# Patient Record
Sex: Male | Born: 1955 | Race: White | Hispanic: No | Marital: Married | Smoking: Never smoker
Health system: Southern US, Community
[De-identification: ages and names within clinical notes are randomized; demographics above are authoritative.]

## PROBLEM LIST (undated history)

## (undated) DIAGNOSIS — E611 Iron deficiency: Secondary | ICD-10-CM

## (undated) DIAGNOSIS — C61 Malignant neoplasm of prostate: Secondary | ICD-10-CM

## (undated) DIAGNOSIS — J40 Bronchitis, not specified as acute or chronic: Secondary | ICD-10-CM

## (undated) DIAGNOSIS — K219 Gastro-esophageal reflux disease without esophagitis: Secondary | ICD-10-CM

## (undated) HISTORY — PX: BACK SURGERY: SHX140

## (undated) HISTORY — PX: HERNIA REPAIR: SHX51

---

## 1998-08-31 ENCOUNTER — Inpatient Hospital Stay (HOSPITAL_COMMUNITY): Admission: RE | Admit: 1998-08-31 | Discharge: 1998-09-01 | Payer: Self-pay | Admitting: Neurosurgery

## 1998-08-31 ENCOUNTER — Encounter: Payer: Self-pay | Admitting: Neurosurgery

## 1998-09-07 ENCOUNTER — Encounter: Payer: Self-pay | Admitting: Internal Medicine

## 1998-09-08 ENCOUNTER — Inpatient Hospital Stay (HOSPITAL_COMMUNITY): Admission: EM | Admit: 1998-09-08 | Discharge: 1998-09-09 | Payer: Self-pay | Admitting: Emergency Medicine

## 1999-12-26 ENCOUNTER — Encounter: Admission: RE | Admit: 1999-12-26 | Discharge: 1999-12-26 | Payer: Self-pay | Admitting: Neurosurgery

## 1999-12-26 ENCOUNTER — Encounter: Payer: Self-pay | Admitting: Neurosurgery

## 2017-08-14 DIAGNOSIS — E785 Hyperlipidemia, unspecified: Secondary | ICD-10-CM | POA: Insufficient documentation

## 2017-08-14 DIAGNOSIS — M545 Low back pain, unspecified: Secondary | ICD-10-CM | POA: Insufficient documentation

## 2017-12-02 DIAGNOSIS — C61 Malignant neoplasm of prostate: Secondary | ICD-10-CM

## 2017-12-02 HISTORY — DX: Malignant neoplasm of prostate: C61

## 2017-12-16 ENCOUNTER — Telehealth: Payer: Self-pay | Admitting: Radiation Oncology

## 2017-12-16 NOTE — Telephone Encounter (Signed)
Patient left message on nurse Sam P voice mail on 12/15/2017 that he wanted to reschedule 12/17/2017 appointment with Dr. Tammi Klippel. I left patient a message today to get this appointment rescheduled.

## 2017-12-17 ENCOUNTER — Telehealth: Payer: Self-pay | Admitting: Radiation Oncology

## 2017-12-17 ENCOUNTER — Ambulatory Visit: Payer: Self-pay | Admitting: Radiation Oncology

## 2017-12-17 ENCOUNTER — Encounter: Payer: Self-pay | Admitting: Radiation Oncology

## 2017-12-17 NOTE — Telephone Encounter (Signed)
I received an in-basket from nurse Sam that this patient left a message on 12/15/17 that he will not be able to come to his appointment on 12/17/2017 with Dr. Tammi Klippel. I have left two messages on 12/16/17 & 12/17/17 to reschedule this patient appointment. The appt has been rs to 10/31/2019at 3:30 pm a letter has also been mailed to the patient.

## 2017-12-29 ENCOUNTER — Telehealth: Payer: Self-pay | Admitting: Radiation Oncology

## 2017-12-29 NOTE — Telephone Encounter (Signed)
Received voicemail from patient wishing to confirm appointment on 01/01/18. Phoned patient back at number provided. No answer. Left message confirming this RN received his voicemail confirmation.

## 2017-12-30 NOTE — Progress Notes (Signed)
GU Location of Tumor / Histology: prostatic adenocarcinoma  If Prostate Cancer, Gleason Score is (4 + 3) and PSA is (8.26). Prostate volume: 14.83 grams.  Stephen Cordova was referred by Dr. Willey Blade to Dr Gloriann Loan after a work screening revealed an elevated PSA for further evaluation of an elevated PSA. Patient reports that before March 2019 he had never been told his PSA was elevated before.    Biopsies of prostate (if applicable) revealed:    Past/Anticipated interventions by urology, if any: prostate biopsy, referral to radiation oncology  Past/Anticipated interventions by medical oncology, if any: no  Weight changes, if any: no  Bowel/Bladder complaints, if any: IPSS 5. SHIM 17.Denies urinary incontinence or leakage. Reports ED.  Denies dysuria, hematuria, urinary leakage or incontinence.  Nausea/Vomiting, if any: no  Pain issues, if any:  Chronic back pain s/p multiple surgeries.  SAFETY ISSUES:  Prior radiation? no  Pacemaker/ICD? no  Possible current pregnancy? no  Is the patient on methotrexate? no  Current Complaints / other details:  62 year old. Married. 2 daughters one deceased.   Family hx:  Brain ca -  Daughter   Liver ca -  Father   Lung ca -  Father   Breast - Mother   Paternal Uncle - cancer ????

## 2018-01-01 ENCOUNTER — Encounter: Payer: Self-pay | Admitting: Radiation Oncology

## 2018-01-01 ENCOUNTER — Other Ambulatory Visit: Payer: Self-pay

## 2018-01-01 ENCOUNTER — Ambulatory Visit
Admission: RE | Admit: 2018-01-01 | Discharge: 2018-01-01 | Disposition: A | Discharge status: Home / Self Care | Payer: BLUE CROSS/BLUE SHIELD | Source: Ambulatory Visit | Attending: Radiation Oncology | Admitting: Radiation Oncology

## 2018-01-01 VITALS — BP 134/80 | HR 80 | Temp 97.7°F | Resp 18 | Ht 71.0 in | Wt 220.2 lb

## 2018-01-01 DIAGNOSIS — Z79899 Other long term (current) drug therapy: Secondary | ICD-10-CM | POA: Diagnosis not present

## 2018-01-01 DIAGNOSIS — C61 Malignant neoplasm of prostate: Secondary | ICD-10-CM

## 2018-01-01 DIAGNOSIS — E669 Obesity, unspecified: Secondary | ICD-10-CM | POA: Insufficient documentation

## 2018-01-01 DIAGNOSIS — G4700 Insomnia, unspecified: Secondary | ICD-10-CM | POA: Insufficient documentation

## 2018-01-01 HISTORY — DX: Malignant neoplasm of prostate: C61

## 2018-01-01 NOTE — Progress Notes (Signed)
Radiation Oncology         [phone] ________________________________  Initial outpatient Consultation  Name: Stephen Cordova MRN: [id number]  Date: 01/01/2018  DOB: April 28, 1955  [id number], Stephen Millin, MD  Stephen Mallow, MD   REFERRING PHYSICIAN: Lucas Mallow, MD  DIAGNOSIS: 62 y.o. gentleman with Stage T1c adenocarcinoma of the prostate with Gleason score of 4+3, and PSA of 8.26.    ICD-10-CM   1. Malignant neoplasm of prostate ([place]) C61     HISTORY OF PRESENT ILLNESS: Stephen Cordova is a 62 y.o. male with a diagnosis of prostate cancer. He was noted to have an elevated PSA of 8.26 by his primary care physician, Dr. Karlton Cordova.  Accordingly, he was referred for evaluation in urology by Dr. Gloriann Cordova on 10/10/2017,  digital rectal examination was performed at that time revealing no nodules, though he notes difficulty palpating secondary to anatomy.  The patient proceeded to transrectal ultrasound with 12 biopsies of the prostate on 12/02/2017.  The prostate volume measured 14.83 cc.  Out of 12 core biopsies, 6 were positive.  The maximum Gleason score was 4+3, and this was seen in all 6 cores on the left with evidence of perineural invasion.  The patient reviewed the biopsy results with his urologist and he has kindly been referred today for discussion of potential radiation treatment options.   PREVIOUS RADIATION THERAPY: No  PAST MEDICAL HISTORY:  Past Medical History:  Diagnosis Date  . Prostate cancer ([place]) 12/02/2017      PAST SURGICAL HISTORY:History reviewed. No pertinent surgical history.  FAMILY HISTORY:  Family History  Problem Relation Age of Onset  . Lung cancer Father   . Liver cancer Father   . Cancer Father   . Brain cancer Daughter   . Breast cancer Mother     SOCIAL HISTORY:  Social History   Socioeconomic History  . Marital status: Married    Spouse name: Not on file  . Number of children: Not on file  . Years of education: Not on file  .  Highest education level: Not on file  Occupational History  . Not on file  Social Needs  . Financial resource strain: Not on file  . Food insecurity:    Worry: Not on file    Inability: Not on file  . Transportation needs:    Medical: Not on file    Non-medical: Not on file  Tobacco Use  . Smoking status: Not on file  Substance and Sexual Activity  . Alcohol use: Not on file  . Drug use: Not on file  . Sexual activity: Not on file  Lifestyle  . Physical activity:    Days per week: Not on file    Minutes per session: Not on file  . Stress: Not on file  Relationships  . Social connections:    Talks on phone: Not on file    Gets together: Not on file    Attends religious service: Not on file    Active member of club or organization: Not on file    Attends meetings of clubs or organizations: Not on file    Relationship status: Not on file  . Intimate partner violence:    Fear of current or ex partner: Not on file    Emotionally abused: Not on file    Physically abused: Not on file    Forced sexual activity: Not on file  Other Topics Concern  . Not on file  Social History  Narrative  . Not on file    ALLERGIES: Patient has no known allergies.  MEDICATIONS:  Current Outpatient Medications  Medication Sig Dispense Refill  . buPROPion (WELLBUTRIN SR) 150 MG 12 hr tablet bupropion HCl SR 150 mg tablet,12 hr sustained-release    . diphenhydrAMINE (SOMINEX) 25 MG tablet Sleep Easy  Take 1 or 2 capsules at bedtime    . fluorouracil (EFUDEX) 5 % cream fluorouracil 5 % topical cream    . fluorouracil (EFUDEX) 5 % cream   0  . levofloxacin (LEVAQUIN) 750 MG tablet TK 1 T PO THE MORNING OF THE PROCEDURE  0  . NON FORMULARY CBD oil 500 mg  Take 1 dropperful by mouth at bedtime    . sildenafil (VIAGRA) 100 MG tablet sildenafil 100 mg tablet  Take 1 tablet every day by oral route as directed.     No current facility-administered medications for this encounter.     REVIEW OF  SYSTEMS:  On review of systems, the patient reports that he is doing well overall. He denies any chest pain, shortness of breath, cough, fevers, chills, night sweats, unintended weight changes. He denies any bowel disturbances, and denies abdominal pain, nausea or vomiting. He denies any new musculoskeletal or joint aches or pains. His IPSS was 5, indicating mild urinary symptoms. His SHIM was 16, indicating he does have mild erectile dysfunction. A complete review of systems is obtained and is otherwise negative.    PHYSICAL EXAM:  Wt Readings from Last 3 Encounters:  01/01/18 220 lb 4 oz (99.9 kg)   Temp Readings from Last 3 Encounters:  01/01/18 97.7 F (36.5 C) (Oral)   BP Readings from Last 3 Encounters:  01/01/18 134/80   Pulse Readings from Last 3 Encounters:  01/01/18 80   Pain Assessment Pain Score: 0-No pain/10  In general this is a well appearing Caucasian gentleman in no acute distress. He is alert and oriented x4 and appropriate throughout the examination. HEENT reveals that the patient is normocephalic, atraumatic. EOMs are intact. PERRLA. Skin is intact without any evidence of gross lesions. Cardiovascular exam reveals a regular rate and rhythm, no clicks rubs or murmurs are auscultated. Chest is clear to auscultation bilaterally. Lymphatic assessment is performed and does not reveal any adenopathy in the cervical, supraclavicular, axillary, or inguinal chains. Abdomen has active bowel sounds in all quadrants and is intact. The abdomen is soft, non tender, non distended. Lower extremities are negative for pretibial pitting edema, deep calf tenderness, cyanosis or clubbing.   KPS = 100  100 - Normal; no complaints; no evidence of disease. 90   - Able to carry on normal activity; minor signs or symptoms of disease. 80   - Normal activity with effort; some signs or symptoms of disease. 101   - Cares for self; unable to carry on normal activity or to do active work. 60   -  Requires occasional assistance, but is able to care for most of his personal needs. 50   - Requires considerable assistance and frequent medical care. 49   - Disabled; requires special care and assistance. 73   - Severely disabled; hospital admission is indicated although death not imminent. 83   - Very sick; hospital admission necessary; active supportive treatment necessary. 10   - Moribund; fatal processes progressing rapidly. 0     - Dead  Karnofsky DA, Abelmann WH, Craver LS and Burchenal [facility] [phone]) The use of the nitrogen mustards in the palliative treatment of carcinoma:  with particular reference to bronchogenic carcinoma Cancer 1 634-56  LABORATORY DATA:  No results found for: WBC, HGB, HCT, MCV, PLT No results found for: NA, K, CL, CO2 No results found for: ALT, AST, GGT, ALKPHOS, BILITOT   RADIOGRAPHY: No results found.    IMPRESSION/PLAN: 1. 62 y.o. gentleman with Stage T1c adenocarcinoma of the prostate with Gleason Score of 4+3, and PSA of 8.26. We discussed the patient's workup and outlined the nature of prostate cancer in this setting. The patient's T stage, Gleason's score, and PSA put him into the unfavorable intermediate risk group. Accordingly, he is eligible for a variety of potential treatment options including brachytherapy, 5.5-8 weeks of external radiation or 5 weeks of external radiation followed by a brachytherapy boost. We discussed the available radiation techniques, and focused on the details and logistics and delivery.  He is not felt to be an ideal candidate for brachytherapy given his very small prostate volume.  We discussed and outlined the risks, benefits, short and long-term effects associated with radiotherapy and compared and contrasted these with prostatectomy. Should he choose radiotherapy for treatment of his disease, our recommendation would be to proceed with a 5-1/2-week course of daily radiotherapy.  We discussed the role of SpaceOAR in reducing the  rectal toxicity associated with radiotherapy.  The patient focused most of his questions and interest in robotic-assisted laparoscopic radical prostatectomy.  We discussed some of the potential advantages of surgery including surgical staging, the availability of salvage radiotherapy to the prostatic fossa, and the confidence associated with immediate biochemical response.  We discussed some of the potential proven indications for postoperative radiotherapy including positive margins, extracapsular extension, and seminal vesicle involvement. We also talked about some of the other potential findings leading to a recommendation for radiotherapy including a non-zero postoperative PSA and positive lymph nodes.  At the end of the conversation, the patient appears to be leaning towards prostatectomy but remains undecided.  We will plan to follow up by phone in the next several weeks to answer any additional questions and ascertain his treatment preference.  Once he has reached his final decision, we will share this information with Dr. Gloriann Cordova and move forward with treatment planning accordingly.  We spent 60 minutes face to face with the patient and more than 50% of that time was spent in counseling and/or coordination of care.    Nicholos Johns, PA-C    Tyler Pita, MD  [facility] Direct Dial: [phone]  Fax: [phone] [email or web address]  Skype  LinkedIn   This document serves as a record of services personally performed by Tyler Pita, MD and Freeman Caldron, PA-C. It was created on their behalf by Wilburn Mylar, a trained medical scribe. The creation of this record is based on the scribe's personal observations and the provider's statements to them. This document has been checked and approved by the attending provider.

## 2018-01-01 NOTE — Progress Notes (Signed)
See progress note under physician encounter. 

## 2018-01-05 ENCOUNTER — Encounter: Payer: Self-pay | Admitting: Radiation Oncology

## 2018-01-06 DIAGNOSIS — C61 Malignant neoplasm of prostate: Secondary | ICD-10-CM | POA: Insufficient documentation

## 2018-01-16 ENCOUNTER — Other Ambulatory Visit: Payer: Self-pay | Admitting: Urology

## 2018-01-19 ENCOUNTER — Encounter: Payer: Self-pay | Admitting: Medical Oncology

## 2018-01-19 NOTE — Progress Notes (Signed)
Mr. Mcgillis consulted with Dr. Tammi Klippel 01/01/18. He was undecided about treatment but was leaning towards prostatectomy. He met with Dr. Gloriann Loan 12/15/17 and had decided to proceed with surgery. I wished him well and asked him to call if I can assist him in any way in the future. Dr. Forrest Moron notified of decision.

## 2018-02-12 NOTE — Patient Instructions (Addendum)
Stephen Cordova  02/12/2018   Your procedure is scheduled on: 02-23-18  Report to [facility] Main  Entrance  Report to admitting at     [place] AM    Call this number if you have problems the morning of surgery [phone]    Remember: Do not eat food or drink liquids :After Midnight.   BRUSH YOUR TEETH MORNING OF SURGERY AND RINSE YOUR MOUTH OUT, NO CHEWING GUM CANDY OR MINTS.     Take these medicines the morning of surgery with A SIP OF WATER :none                                 You may not have any metal on your body including hair pins and              piercings  Do not wear jewelry,  lotions, powders or perfumes, deodorant                        Men may shave face and neck.   Do not bring valuables to the hospital. [place].  Contacts, dentures or bridgework may not be worn into surgery.  Leave suitcase in the car. After surgery it may be brought to your room.               Please read over the following fact sheets you were given: _____________________________________________________________________           [facility] - Preparing for Surgery Before surgery, you can play an important role.  Because skin is not sterile, your skin needs to be as free of germs as possible.  You can reduce the number of germs on your skin by washing with CHG (chlorahexidine gluconate) soap before surgery.  CHG is an antiseptic cleaner which kills germs and bonds with the skin to continue killing germs even after washing. Please DO NOT use if you have an allergy to CHG or antibacterial soaps.  If your skin becomes reddened/irritated stop using the CHG and inform your nurse when you arrive at Short Stay. Do not shave (including legs and underarms) for at least 48 hours prior to the first CHG shower.  You may shave your face/neck. Please follow these instructions carefully:  1.  Shower with CHG Soap the night before  surgery and the  morning of Surgery.  2.  If you choose to wash your hair, wash your hair first as usual with your  normal  shampoo.  3.  After you shampoo, rinse your hair and body thoroughly to remove the  shampoo.                           4.  Use CHG as you would any other liquid soap.  You can apply chg directly  to the skin and wash                       Gently with a scrungie or clean washcloth.  5.  Apply the CHG Soap to your body ONLY FROM THE NECK DOWN.   Do not use on face/ open  Wound or open sores. Avoid contact with eyes, ears mouth and genitals (private parts).                       Wash face,  Genitals (private parts) with your normal soap.             6.  Wash thoroughly, paying special attention to the area where your surgery  will be performed.  7.  Thoroughly rinse your body with warm water from the neck down.  8.  DO NOT shower/wash with your normal soap after using and rinsing off  the CHG Soap.                9.  Pat yourself dry with a clean towel.            10.  Wear clean pajamas.            11.  Place clean sheets on your bed the night of your first shower and do not  sleep with pets. Day of Surgery : Do not apply any lotions/deodorants the morning of surgery.  Please wear clean clothes to the hospital/surgery center.  FAILURE TO FOLLOW THESE INSTRUCTIONS MAY RESULT IN THE CANCELLATION OF YOUR SURGERY PATIENT SIGNATURE_________________________________  NURSE SIGNATURE__________________________________  ________________________________________________________________________  WHAT IS A BLOOD TRANSFUSION? Blood Transfusion Information  A transfusion is the replacement of blood or some of its parts. Blood is made up of multiple cells which provide different functions.  Red blood cells carry oxygen and are used for blood loss replacement.  White blood cells fight against infection.  Platelets control bleeding.  Plasma helps clot  blood.  Other blood products are available for specialized needs, such as hemophilia or other clotting disorders. BEFORE THE TRANSFUSION  Who gives blood for transfusions?   Healthy volunteers who are fully evaluated to make sure their blood is safe. This is blood bank blood. Transfusion therapy is the safest it has ever been in the practice of medicine. Before blood is taken from a donor, a complete history is taken to make sure that person has no history of diseases nor engages in risky social behavior (examples are intravenous drug use or sexual activity with multiple partners). The donor's travel history is screened to minimize risk of transmitting infections, such as malaria. The donated blood is tested for signs of infectious diseases, such as HIV and hepatitis. The blood is then tested to be sure it is compatible with you in order to minimize the chance of a transfusion reaction. If you or a relative donates blood, this is often done in anticipation of surgery and is not appropriate for emergency situations. It takes many days to process the donated blood. RISKS AND COMPLICATIONS Although transfusion therapy is very safe and saves many lives, the main dangers of transfusion include:   Getting an infectious disease.  Developing a transfusion reaction. This is an allergic reaction to something in the blood you were given. Every precaution is taken to prevent this. The decision to have a blood transfusion has been considered carefully by your caregiver before blood is given. Blood is not given unless the benefits outweigh the risks. AFTER THE TRANSFUSION  Right after receiving a blood transfusion, you will usually feel much better and more energetic. This is especially true if your red blood cells have gotten low (anemic). The transfusion raises the level of the red blood cells which carry oxygen, and this usually causes an energy increase.  The  nurse administering the transfusion will monitor  you carefully for complications. HOME CARE INSTRUCTIONS  No special instructions are needed after a transfusion. You may find your energy is better. Speak with your caregiver about any limitations on activity for underlying diseases you may have. SEEK MEDICAL CARE IF:   Your condition is not improving after your transfusion.  You develop redness or irritation at the intravenous (IV) site. SEEK IMMEDIATE MEDICAL CARE IF:  Any of the following symptoms occur over the next 12 hours:  Shaking chills.  You have a temperature by mouth above 102 F (38.9 C), not controlled by medicine.  Chest, back, or muscle pain.  People around you feel you are not acting correctly or are confused.  Shortness of breath or difficulty breathing.  Dizziness and fainting.  You get a rash or develop hives.  You have a decrease in urine output.  Your urine turns a dark color or changes to pink, red, or brown. Any of the following symptoms occur over the next 10 days:  You have a temperature by mouth above 102 F (38.9 C), not controlled by medicine.  Shortness of breath.  Weakness after normal activity.  The white part of the eye turns yellow (jaundice).  You have a decrease in the amount of urine or are urinating less often.  Your urine turns a dark color or changes to pink, red, or brown. Document Released: 02/16/2000 Document Revised: 05/13/2011 Document Reviewed: 10/05/2007 [facility] Patient Information 2014 [place], [place].  _______________________________________________________________________

## 2018-02-17 ENCOUNTER — Encounter (HOSPITAL_COMMUNITY): Payer: Self-pay

## 2018-02-17 ENCOUNTER — Other Ambulatory Visit: Payer: Self-pay

## 2018-02-17 ENCOUNTER — Encounter (HOSPITAL_COMMUNITY)
Admission: RE | Admit: 2018-02-17 | Discharge: 2018-02-17 | Disposition: A | Discharge status: Home / Self Care | Payer: BLUE CROSS/BLUE SHIELD | Source: Ambulatory Visit | Attending: Urology | Admitting: Urology

## 2018-02-17 DIAGNOSIS — Z01812 Encounter for preprocedural laboratory examination: Secondary | ICD-10-CM | POA: Insufficient documentation

## 2018-02-17 DIAGNOSIS — C61 Malignant neoplasm of prostate: Secondary | ICD-10-CM | POA: Diagnosis not present

## 2018-02-17 HISTORY — DX: Iron deficiency: E61.1

## 2018-02-17 HISTORY — DX: Gastro-esophageal reflux disease without esophagitis: K21.9

## 2018-02-17 HISTORY — DX: Bronchitis, not specified as acute or chronic: J40

## 2018-02-17 LAB — CBC
HCT: 43 % (ref 39.0–52.0)
Hemoglobin: 14.2 g/dL (ref 13.0–17.0)
MCH: 29.1 pg (ref 26.0–34.0)
MCHC: 33 g/dL (ref 30.0–36.0)
MCV: 88.1 fL (ref 80.0–100.0)
NRBC: 0 % (ref 0.0–0.2)
PLATELETS: 240 10*3/uL (ref 150–400)
RBC: 4.88 MIL/uL (ref 4.22–5.81)
RDW: 13 % (ref 11.5–15.5)
WBC: 7.7 10*3/uL (ref 4.0–10.5)

## 2018-02-17 LAB — BASIC METABOLIC PANEL
ANION GAP: 7 (ref 5–15)
BUN: 14 mg/dL (ref 8–23)
CALCIUM: 8.9 mg/dL (ref 8.9–10.3)
CHLORIDE: 106 mmol/L (ref 98–111)
CO2: 26 mmol/L (ref 22–32)
Creatinine, Ser: 0.89 mg/dL (ref 0.61–1.24)
GFR calc Af Amer: >60 mL/min (ref >60)
GFR calc non Af Amer: >60 mL/min (ref >60)
Glucose, Bld: 111 mg/dL — ABNORMAL HIGH (ref 70–99)
Potassium: 3.9 mmol/L (ref 3.5–5.1)
Sodium: 139 mmol/L (ref 135–145)

## 2018-02-17 LAB — PROTIME-INR
INR: 0.95
PROTHROMBIN TIME: 12.6 s (ref 11.4–15.2)

## 2018-02-17 LAB — ABO/RH: ABO/RH(D): O POS

## 2018-02-23 ENCOUNTER — Other Ambulatory Visit: Payer: Self-pay

## 2018-02-23 ENCOUNTER — Ambulatory Visit (HOSPITAL_COMMUNITY): Payer: BLUE CROSS/BLUE SHIELD | Admitting: Certified Registered Nurse Anesthetist

## 2018-02-23 ENCOUNTER — Ambulatory Visit (HOSPITAL_COMMUNITY)
Admission: RE | Admit: 2018-02-23 | Discharge: 2018-02-24 | Disposition: A | Discharge status: Home / Self Care | Payer: BLUE CROSS/BLUE SHIELD | Attending: Urology | Admitting: Urology

## 2018-02-23 ENCOUNTER — Encounter (HOSPITAL_COMMUNITY): Payer: Self-pay | Admitting: General Practice

## 2018-02-23 ENCOUNTER — Encounter (HOSPITAL_COMMUNITY)
Admission: RE | Disposition: A | Discharge status: Home / Self Care | Payer: Self-pay | Source: Home / Self Care | Attending: Urology

## 2018-02-23 DIAGNOSIS — C61 Malignant neoplasm of prostate: Secondary | ICD-10-CM | POA: Insufficient documentation

## 2018-02-23 DIAGNOSIS — K219 Gastro-esophageal reflux disease without esophagitis: Secondary | ICD-10-CM | POA: Diagnosis not present

## 2018-02-23 DIAGNOSIS — Z79899 Other long term (current) drug therapy: Secondary | ICD-10-CM | POA: Diagnosis not present

## 2018-02-23 HISTORY — PX: PELVIC LYMPH NODE DISSECTION: SHX6543

## 2018-02-23 HISTORY — PX: ROBOT ASSISTED LAPAROSCOPIC RADICAL PROSTATECTOMY: SHX5141

## 2018-02-23 LAB — BASIC METABOLIC PANEL
ANION GAP: 9 (ref 5–15)
BUN: 15 mg/dL (ref 8–23)
CO2: 25 mmol/L (ref 22–32)
Calcium: 8 mg/dL — ABNORMAL LOW (ref 8.9–10.3)
Chloride: 105 mmol/L (ref 98–111)
Creatinine, Ser: 1.14 mg/dL (ref 0.61–1.24)
GFR calc Af Amer: >60 mL/min (ref >60)
Glucose, Bld: 134 mg/dL — ABNORMAL HIGH (ref 70–99)
Potassium: 3.8 mmol/L (ref 3.5–5.1)
Sodium: 139 mmol/L (ref 135–145)

## 2018-02-23 LAB — HEMOGLOBIN AND HEMATOCRIT, BLOOD
HCT: 40 % (ref 39.0–52.0)
Hemoglobin: 13 g/dL (ref 13.0–17.0)

## 2018-02-23 LAB — TYPE AND SCREEN
ABO/RH(D): O POS
ANTIBODY SCREEN: NEGATIVE

## 2018-02-23 SURGERY — PROSTATECTOMY, RADICAL, ROBOT-ASSISTED, LAPAROSCOPIC
Anesthesia: General

## 2018-02-23 MED ORDER — FENTANYL CITRATE (PF) 100 MCG/2ML IJ SOLN
INTRAMUSCULAR | Status: AC
  Filled 2018-02-23: qty 2

## 2018-02-23 MED ORDER — LACTATED RINGERS IV SOLN
INTRAVENOUS | Status: DC
  Administered 2018-02-23: 10:00:00 via INTRAVENOUS

## 2018-02-23 MED ORDER — DEXTROSE-NACL 5-0.45 % IV SOLN
INTRAVENOUS | Status: AC
  Administered 2018-02-23 – 2018-02-24 (×2): via INTRAVENOUS

## 2018-02-23 MED ORDER — PROPOFOL 10 MG/ML IV BOLUS
INTRAVENOUS | Status: DC | PRN
  Administered 2018-02-23: 170 mg via INTRAVENOUS

## 2018-02-23 MED ORDER — ROCURONIUM BROMIDE 10 MG/ML (PF) SYRINGE
PREFILLED_SYRINGE | INTRAVENOUS | Status: AC
  Filled 2018-02-23: qty 10

## 2018-02-23 MED ORDER — FENTANYL CITRATE (PF) 100 MCG/2ML IJ SOLN
INTRAMUSCULAR | Status: DC | PRN
  Administered 2018-02-23 (×5): 50 ug via INTRAVENOUS
  Administered 2018-02-23: 100 ug via INTRAVENOUS

## 2018-02-23 MED ORDER — ONDANSETRON HCL 4 MG/2ML IJ SOLN: 4.0000 mg | Freq: Once | INTRAMUSCULAR | Status: DC | PRN

## 2018-02-23 MED ORDER — EPHEDRINE SULFATE-NACL 50-0.9 MG/10ML-% IV SOSY
PREFILLED_SYRINGE | INTRAVENOUS | Status: DC | PRN
  Administered 2018-02-23 (×2): 10 mg via INTRAVENOUS

## 2018-02-23 MED ORDER — ONDANSETRON HCL 4 MG/2ML IJ SOLN
INTRAMUSCULAR | Status: AC
  Filled 2018-02-23: qty 2

## 2018-02-23 MED ORDER — STERILE WATER FOR IRRIGATION IR SOLN
Status: DC | PRN
  Administered 2018-02-23: 1000 mL

## 2018-02-23 MED ORDER — FENTANYL CITRATE (PF) 250 MCG/5ML IJ SOLN
INTRAMUSCULAR | Status: AC
  Filled 2018-02-23: qty 5

## 2018-02-23 MED ORDER — LIDOCAINE 2% (20 MG/ML) 5 ML SYRINGE
INTRAMUSCULAR | Status: DC | PRN
  Administered 2018-02-23: 100 mg via INTRAVENOUS

## 2018-02-23 MED ORDER — OXYCODONE HCL 5 MG/5ML PO SOLN: 5.0000 mg | Freq: Once | ORAL | Status: DC | PRN

## 2018-02-23 MED ORDER — SODIUM CHLORIDE 0.9 % IV BOLUS
1000.0000 mL | Freq: Once | INTRAVENOUS | Status: AC
  Administered 2018-02-23: 1000 mL via INTRAVENOUS

## 2018-02-23 MED ORDER — BACITRACIN-NEOMYCIN-POLYMYXIN 400-5-5000 EX OINT: 1.0000 "application " | TOPICAL_OINTMENT | Freq: Three times a day (TID) | CUTANEOUS | Status: DC | PRN

## 2018-02-23 MED ORDER — LACTATED RINGERS IR SOLN
Status: DC | PRN
  Administered 2018-02-23: 1000 mL

## 2018-02-23 MED ORDER — OXYCODONE HCL 5 MG PO TABS: 5.0000 mg | ORAL_TABLET | Freq: Once | ORAL | Status: DC | PRN

## 2018-02-23 MED ORDER — DOCUSATE SODIUM 100 MG PO CAPS
100.0000 mg | ORAL_CAPSULE | Freq: Two times a day (BID) | ORAL | Status: DC
  Administered 2018-02-24: 100 mg via ORAL
  Filled 2018-02-23: qty 1

## 2018-02-23 MED ORDER — SULFAMETHOXAZOLE-TRIMETHOPRIM 800-160 MG PO TABS: 1.0000 | ORAL_TABLET | Freq: Two times a day (BID) | ORAL | 0 refills | Status: AC

## 2018-02-23 MED ORDER — LACTATED RINGERS IV SOLN
INTRAVENOUS | Status: DC | PRN
  Administered 2018-02-23 (×3): via INTRAVENOUS

## 2018-02-23 MED ORDER — ACETAMINOPHEN 325 MG PO TABS: 325.0000 mg | ORAL_TABLET | ORAL | Status: DC | PRN

## 2018-02-23 MED ORDER — OXYCODONE HCL 5 MG PO TABS: 5.0000 mg | ORAL_TABLET | ORAL | Status: DC | PRN

## 2018-02-23 MED ORDER — FENTANYL CITRATE (PF) 100 MCG/2ML IJ SOLN
25.0000 ug | INTRAMUSCULAR | Status: DC | PRN
  Administered 2018-02-23 (×2): 50 ug via INTRAVENOUS

## 2018-02-23 MED ORDER — ONDANSETRON HCL 4 MG/2ML IJ SOLN
INTRAMUSCULAR | Status: DC | PRN
  Administered 2018-02-23: 4 mg via INTRAVENOUS

## 2018-02-23 MED ORDER — MEPERIDINE HCL 50 MG/ML IJ SOLN: 6.2500 mg | INTRAMUSCULAR | Status: DC | PRN

## 2018-02-23 MED ORDER — SODIUM CHLORIDE (PF) 0.9 % IJ SOLN
INTRAMUSCULAR | Status: AC
  Filled 2018-02-23: qty 20

## 2018-02-23 MED ORDER — CEFAZOLIN SODIUM-DEXTROSE 2-4 GM/100ML-% IV SOLN
2.0000 g | INTRAVENOUS | Status: AC
  Administered 2018-02-23: 2 g via INTRAVENOUS
  Filled 2018-02-23: qty 100

## 2018-02-23 MED ORDER — MIDAZOLAM HCL 2 MG/2ML IJ SOLN
INTRAMUSCULAR | Status: AC
  Filled 2018-02-23: qty 2

## 2018-02-23 MED ORDER — PROPOFOL 10 MG/ML IV BOLUS
INTRAVENOUS | Status: AC
  Filled 2018-02-23: qty 20

## 2018-02-23 MED ORDER — SENNA 8.6 MG PO TABS
1.0000 | ORAL_TABLET | Freq: Two times a day (BID) | ORAL | Status: DC
  Administered 2018-02-24: 8.6 mg via ORAL
  Filled 2018-02-23: qty 1

## 2018-02-23 MED ORDER — CEFAZOLIN SODIUM-DEXTROSE 1-4 GM/50ML-% IV SOLN
1.0000 g | Freq: Three times a day (TID) | INTRAVENOUS | Status: AC
  Administered 2018-02-23 – 2018-02-24 (×2): 1 g via INTRAVENOUS
  Filled 2018-02-23 (×2): qty 50

## 2018-02-23 MED ORDER — ROCURONIUM BROMIDE 50 MG/5ML IV SOSY
PREFILLED_SYRINGE | INTRAVENOUS | Status: DC | PRN
  Administered 2018-02-23: 10 mg via INTRAVENOUS
  Administered 2018-02-23 (×2): 20 mg via INTRAVENOUS
  Administered 2018-02-23: 60 mg via INTRAVENOUS
  Administered 2018-02-23: 10 mg via INTRAVENOUS
  Administered 2018-02-23: 20 mg via INTRAVENOUS
  Administered 2018-02-23: 10 mg via INTRAVENOUS

## 2018-02-23 MED ORDER — LIDOCAINE 2% (20 MG/ML) 5 ML SYRINGE
INTRAMUSCULAR | Status: AC
  Filled 2018-02-23: qty 5

## 2018-02-23 MED ORDER — EPHEDRINE 5 MG/ML INJ
INTRAVENOUS | Status: AC
  Filled 2018-02-23: qty 10

## 2018-02-23 MED ORDER — DEXAMETHASONE SODIUM PHOSPHATE 10 MG/ML IJ SOLN
INTRAMUSCULAR | Status: DC | PRN
  Administered 2018-02-23: 10 mg via INTRAVENOUS

## 2018-02-23 MED ORDER — SUGAMMADEX SODIUM 200 MG/2ML IV SOLN
INTRAVENOUS | Status: AC
  Filled 2018-02-23: qty 2

## 2018-02-23 MED ORDER — MORPHINE SULFATE (PF) 2 MG/ML IV SOLN
1.0000 mg | INTRAVENOUS | Status: DC | PRN
  Administered 2018-02-23: 1 mg via INTRAVENOUS
  Filled 2018-02-23: qty 1

## 2018-02-23 MED ORDER — ACETAMINOPHEN 160 MG/5ML PO SOLN: 325.0000 mg | ORAL | Status: DC | PRN

## 2018-02-23 MED ORDER — PHENYLEPHRINE 40 MCG/ML (10ML) SYRINGE FOR IV PUSH (FOR BLOOD PRESSURE SUPPORT)
PREFILLED_SYRINGE | INTRAVENOUS | Status: DC | PRN
  Administered 2018-02-23: 80 ug via INTRAVENOUS
  Administered 2018-02-23: 40 ug via INTRAVENOUS
  Administered 2018-02-23: 120 ug via INTRAVENOUS
  Administered 2018-02-23: 40 ug via INTRAVENOUS
  Administered 2018-02-23: 80 ug via INTRAVENOUS

## 2018-02-23 MED ORDER — ONDANSETRON HCL 4 MG/2ML IJ SOLN
4.0000 mg | INTRAMUSCULAR | Status: DC | PRN
  Administered 2018-02-23: 4 mg via INTRAVENOUS
  Filled 2018-02-23: qty 2

## 2018-02-23 MED ORDER — ACETAMINOPHEN 500 MG PO TABS
1000.0000 mg | ORAL_TABLET | Freq: Four times a day (QID) | ORAL | Status: AC
  Administered 2018-02-23 – 2018-02-24 (×4): 1000 mg via ORAL
  Filled 2018-02-23 (×4): qty 2

## 2018-02-23 MED ORDER — MIDAZOLAM HCL 5 MG/5ML IJ SOLN
INTRAMUSCULAR | Status: DC | PRN
  Administered 2018-02-23: 2 mg via INTRAVENOUS

## 2018-02-23 MED ORDER — BUPIVACAINE LIPOSOME 1.3 % IJ SUSP
20.0000 mL | Freq: Once | INTRAMUSCULAR | Status: AC
  Administered 2018-02-23: 20 mL
  Filled 2018-02-23: qty 20

## 2018-02-23 MED ORDER — DEXAMETHASONE SODIUM PHOSPHATE 10 MG/ML IJ SOLN
INTRAMUSCULAR | Status: AC
  Filled 2018-02-23: qty 1

## 2018-02-23 MED ORDER — PHENYLEPHRINE 40 MCG/ML (10ML) SYRINGE FOR IV PUSH (FOR BLOOD PRESSURE SUPPORT)
PREFILLED_SYRINGE | INTRAVENOUS | Status: AC
  Filled 2018-02-23: qty 10

## 2018-02-23 MED ORDER — HYDROCODONE-ACETAMINOPHEN 5-325 MG PO TABS: 1.0000 | ORAL_TABLET | ORAL | 0 refills | Status: AC | PRN

## 2018-02-23 SURGICAL SUPPLY — 56 items
APPLICATOR COTTON TIP 6 STRL (MISCELLANEOUS) ×2 IMPLANT
APPLICATOR COTTON TIP 6IN STRL (MISCELLANEOUS) ×3
APPLICATOR SURGIFLO ENDO (HEMOSTASIS) IMPLANT
CATH FOLEY 2WAY SLVR  5CC 18FR (CATHETERS) ×1
CATH FOLEY 2WAY SLVR 5CC 18FR (CATHETERS) ×2 IMPLANT
CATH TIEMANN FOLEY 18FR 5CC (CATHETERS) ×3 IMPLANT
CHLORAPREP W/TINT 26ML (MISCELLANEOUS) ×3 IMPLANT
CLIP VESOLOCK LG 6/CT PURPLE (CLIP) ×15 IMPLANT
COVER SURGICAL LIGHT HANDLE (MISCELLANEOUS) ×3 IMPLANT
COVER TIP SHEARS 8 DVNC (MISCELLANEOUS) ×2 IMPLANT
COVER TIP SHEARS 8MM DA VINCI (MISCELLANEOUS) ×1
COVER WAND RF STERILE (DRAPES) ×3 IMPLANT
CUTTER ECHEON FLEX ENDO 45 340 (ENDOMECHANICALS) ×3 IMPLANT
DECANTER SPIKE VIAL GLASS SM (MISCELLANEOUS) ×3 IMPLANT
DERMABOND ADVANCED (GAUZE/BANDAGES/DRESSINGS)
DERMABOND ADVANCED .7 DNX12 (GAUZE/BANDAGES/DRESSINGS) IMPLANT
DRAPE ARM DVNC X/XI (DISPOSABLE) ×8 IMPLANT
DRAPE COLUMN DVNC XI (DISPOSABLE) ×2 IMPLANT
DRAPE DA VINCI XI ARM (DISPOSABLE) ×4
DRAPE DA VINCI XI COLUMN (DISPOSABLE) ×1
DRAPE SURG IRRIG POUCH 19X23 (DRAPES) ×3 IMPLANT
DRSG TEGADERM 4X4.75 (GAUZE/BANDAGES/DRESSINGS) ×3 IMPLANT
ELECT REM PT RETURN 15FT ADLT (MISCELLANEOUS) ×3 IMPLANT
FLOSEAL 10ML (HEMOSTASIS) IMPLANT
GLOVE BIO SURGEON STRL SZ 6.5 (GLOVE) ×3 IMPLANT
GLOVE BIO SURGEON STRL SZ7.5 (GLOVE) ×6 IMPLANT
GOWN STRL REUS W/TWL LRG LVL3 (GOWN DISPOSABLE) ×3 IMPLANT
GOWN STRL REUS W/TWL XL LVL3 (GOWN DISPOSABLE) ×6 IMPLANT
HEMOSTAT SURGICEL 4X8 (HEMOSTASIS) ×3 IMPLANT
HOLDER FOLEY CATH W/STRAP (MISCELLANEOUS) ×3 IMPLANT
IRRIG SUCT STRYKERFLOW 2 WTIP (MISCELLANEOUS) ×3
IRRIGATION SUCT STRKRFLW 2 WTP (MISCELLANEOUS) ×2 IMPLANT
IV LACTATED RINGERS 1000ML (IV SOLUTION) ×3 IMPLANT
NEEDLE INSUFFLATION 14GA 120MM (NEEDLE) ×3 IMPLANT
PACK ROBOT UROLOGY CUSTOM (CUSTOM PROCEDURE TRAY) ×3 IMPLANT
PAD POSITIONING PINK XL (MISCELLANEOUS) ×3 IMPLANT
PORT ACCESS TROCAR AIRSEAL 12 (TROCAR) ×2 IMPLANT
PORT ACCESS TROCAR AIRSEAL 5M (TROCAR) ×1
SEAL CANN UNIV 5-8 DVNC XI (MISCELLANEOUS) ×8 IMPLANT
SEAL XI 5MM-8MM UNIVERSAL (MISCELLANEOUS) ×4
SET TRI-LUMEN FLTR TB AIRSEAL (TUBING) ×3 IMPLANT
SOLUTION ELECTROLUBE (MISCELLANEOUS) ×3 IMPLANT
SPONGE LAP 4X18 RFD (DISPOSABLE) ×3 IMPLANT
STAPLE RELOAD 45 GRN (STAPLE) ×2 IMPLANT
STAPLE RELOAD 45MM GREEN (STAPLE) ×1
SUT ETHILON 3 0 PS 1 (SUTURE) ×3 IMPLANT
SUT MNCRL AB 4-0 PS2 18 (SUTURE) ×6 IMPLANT
SUT VIC AB 0 UR5 27 (SUTURE) ×3 IMPLANT
SUT VIC AB 2-0 SH 27 (SUTURE) ×1
SUT VIC AB 2-0 SH 27X BRD (SUTURE) ×2 IMPLANT
SUT VICRYL 0 UR6 27IN ABS (SUTURE) ×6 IMPLANT
SUT VLOC 180 2-0 6IN GS21 (SUTURE) ×3 IMPLANT
SUT VLOC BARB 180 ABS3/0GR12 (SUTURE) ×6
SUTURE VLOC BRB 180 ABS3/0GR12 (SUTURE) ×4 IMPLANT
TOWEL OR NON WOVEN STRL DISP B (DISPOSABLE) ×3 IMPLANT
WATER STERILE IRR 1000ML POUR (IV SOLUTION) ×3 IMPLANT

## 2018-02-23 NOTE — Progress Notes (Signed)
Attempted to get pt OOB and ambulate in hallway. Pt stood up at the bedside, took a few steps and immediately felt very hot, nauseated and c/o 7/10 pain. Laid pt back down in bed & administered pain medication and anti-nausea medication. Will attempt to get pt up and ambulating in the hallway later on.

## 2018-02-23 NOTE — Discharge Summary (Signed)
[facility] Discharge Summary  Admit date: 02/23/2018  Discharge date and time: 02/23/18   Discharge to: Home  Discharge Service: Urology  Discharge Attending Physician:  Link Snuffer  Discharge  Diagnoses: Prostate cancer  Secondary Diagnosis: Active Problems:   * No active hospital problems. *   OR Procedures: Procedure(s): XI ROBOTIC ASSISTED LAPAROSCOPIC RADICAL PROSTATECTOMY PELVIC LYMPH NODE DISSECTION 02/23/2018   Ancillary Procedures: None   Discharge Day Services: The patient was seen and examined by the Urology team both in the morning and immediately prior to discharge.  Vital signs and laboratory values were stable and within normal limits.  The physical exam was benign and unchanged and all surgical wounds were examined.  Discharge instructions were explained and all questions answered.  Subjective  No acute events overnight. Pain Controlled. No fever or chills.  Objective Patient Vitals for the past 8 hrs:  BP Temp Temp src Pulse Resp SpO2 Height Weight  02/23/18 1004 138/87 97.7 F (36.5 C) Oral 86 16 97 % 5\' 11"  (1.803 m) 97.5 kg   Total I/O In: 1400 [I.V.:1400] Out: 250 [Blood:250]  General Appearance:        No acute distress Lungs:                       Normal work of breathing on room air Heart:                                Regular rate and rhythm Abdomen:                         Soft, non-tender, non-distended, incisions clean dry and intact Extremities:                      Warm and well perfused   Hospital Course:  The patient underwent robot assisted laparoscopic prostatectomy and bilateral pelvic lymph node dissection on 02/23/2018.  The patient tolerated the procedure well, was extubated in the OR, and afterwards was taken to the PACU for routine post-surgical care. When stable the patient was transferred to the floor.   The patient did well postoperatively.  The patient's diet was slowly advanced and at the time of discharge was  tolerating an appropriate diet.  The patient was discharged home on POD1, at which point was tolerating a regular solid diet, have adequate pain control with P.O. pain medication, and could ambulate without difficulty. The patient will follow up with [place] for post op check. Discharged with foley catheter in place to drainage. JP was removed prior to discharge.   Condition at Discharge: Improved  Discharge Medications:  Allergies as of 02/23/2018   No Known Allergies     Medication List    TAKE these medications   HYDROcodone-acetaminophen 5-325 MG tablet Commonly known as:  NORCO/VICODIN Take 1 tablet by mouth every 4 (four) hours as needed for up to 5 days for severe pain.   sulfamethoxazole-trimethoprim 800-160 MG tablet Commonly known as:  BACTRIM DS,SEPTRA DS Take 1 tablet by mouth 2 (two) times daily for 3 days. Start the day prior to your follow-up appointment. Start taking on:  March 01, 2018

## 2018-02-23 NOTE — Op Note (Signed)
Operative Note  Preoperative diagnosis:  1.  Prostate cancer   Postoperative diagnosis: 1.  Prostate cancer  Procedure(s): 1.  Robotic assisted laparoscopic prostatectomy with bilateral pelvic lymph node dissection  Surgeon: Link Snuffer, MD  Assistants: Andee Poles, MD--an assistant was needed due to the nature of the case being a robotic surgery requiring a bedside assistant for retraction, suctioning, exchanging instruments, etc.   Anesthesia: General   Complications: None   EBL: 150 cc   Specimens: 1. prostate and seminal vesicles 2.  Periprosthetic fat 3.  Right pelvic lymph node packet 4.  Left pelvic lymph node packet  Drains/Catheters: 1. 68 French Foley catheter and JP drain   Intraoperative findings: prostate and seminal vesicles were removed en bloc Without any evidence of gross extraprostatic disease  Indication: 62 year old male with a recent diagnosis of prostate cancer elected to undergo the above operation.  Description of procedure:  The patient was identified and consent was obtained.  The patient was taken to the operating room and placed in the supine position.  The patient was placed under general anesthesia.  Perioperative antibiotics were administered.  The patient was placed in dorsal lithotomy.  Patient was prepped and draped in a standard sterile fashion and a timeout was performed.  The patient was placed in steep Trendelenburg.  Foley catheter was placed.  Veress needle was inserted supraumbilical and the drop test was performed with no evidence of any injury.  The abdomen was then insufflated to a pressure of 15.  4 robotic working ports, a 12 mm assistant port, and a 5 mm assistant port were placed under direct visualization in a standard fashion for a robotic pelvic surgery.  The abdomen was inspected and there was no evidence of any visceral or vascular injury.  I first released colonic adhesions in the left lower quadrant.  I then retracted  the sigmoid and rectum superiorly.  I first started with the posterior dissection by incising along the posterior peritoneum and identifying bilateral vas deferens.  These were divided and bilateral seminal vesicles were carefully dissected out.  Denonvier fascia was then entered posteriorly.  The bladder was then dropped by incising along the medial umbilical ligament bilaterally.  Periprosthetic fat was cleared off the prostate and passed off and passed off for specimen.  The endopelvic fascia was incised bilaterally and the puboprostatic ligaments were released.  A stapler with a vascular staple load was used to staple the dorsal venous complex.  I then incised along into the bladder neck and divided the bladder neck.  The catheter was brought out and used for retraction.  I divided the remainder of the bladder neck.  During this portion of the division, a small cystotomy was made and it was closed with a running 2-0 V lock suture.  I then completed the division followed by pulling the seminal vesicles out of this incision.  Careful dissection was performed and bilateral prostatic pedicles were released using Hem-o-lok clips and sharp dissection.  Spot cautery was sparingly used.  Periprosthetic tissue was carefully released inferiorly and laterally, performing a nerve sparing operation on the right and a non-nerve sparing on the left.  Once only the urethra remained attached, the anterior portion of the urethra was divided.  A 2-0 Vicryl stitch was placed at the 6 o'clock position.  The remainder of the urethra was divided and the prostate was placed in a specimen bag.  FloSeal was applied to the prostatectomy bed.  I then performed the bilateral pelvic  lymph node dissection.  I first carefully removed lymph node packet from the right side overlying the external iliac artery and vein down to the level of the obturator nerve.  This was passed off for specimen and then I carefully removed the left sided pelvic  lymph node packets again overlying the external iliac artery and vein down to the level of the obturator nerve taking care not to injure any vital structures.  The 2-0 Vicryl stitch was used to reapproximate the bladder and urethra at the 6 o'clock position.  A running 3-0 V lock suture was then used to reapproximate the bladder and urethra.  The bladder was filled with normal saline and there was no evidence of any leak at the anastomosis.  The anastomosis was watertight.  The Foley catheter was exchanged for a fresh catheter.  A JP drain was inserted from the left lateral port site.  This was secured down with a nylon stitch.  The robot was undocked and the patient was taken out of Trendelenburg.  All working ports were removed under direct visualization with the camera to ensure there was no bleeding from the sites.  The midline port incision was extended and the prostate extracted in the specimen bag.  Interrupted figure-of-eight 0 Vicryl sutures were used to close  the fascia.  The 12 mm assistant port fascia was also closed with a 2-0 Vicryl.  Skin was then closed with 4-0 Monocryl and Dermabond.  Exparel was used for anesthetic effect.  This concluded the operation.  The patient tolerated procedure well and was stable postoperatively.  Plan: Will obtain stat labs.  He will remain in the hospital overnight and hopefully be able to be discharged tomorrow.  He will keep his catheter for 7-10 days.

## 2018-02-23 NOTE — Anesthesia Preprocedure Evaluation (Addendum)
Anesthesia Evaluation  Patient identified by MRN, date of birth, ID band Patient awake    Reviewed: Allergy & Precautions, H&P , NPO status , Patient's Chart, lab work & pertinent test results, reviewed documented beta blocker date and time   Airway Mallampati: II  TM Distance: >3 FB Neck ROM: full    Dental no notable dental hx.    Pulmonary neg pulmonary ROS,    Pulmonary exam normal breath sounds clear to auscultation       Cardiovascular Exercise Tolerance: Good negative cardio ROS   Rhythm:regular Rate:Normal     Neuro/Psych negative neurological ROS  negative psych ROS   GI/Hepatic negative GI ROS, Neg liver ROS, GERD  ,  Endo/Other  negative endocrine ROS  Renal/GU negative Renal ROS  negative genitourinary   Musculoskeletal   Abdominal   Peds  Hematology negative hematology ROS (+)   Anesthesia Other Findings   Reproductive/Obstetrics negative OB ROS                            Anesthesia Physical Anesthesia Plan  ASA: II  Anesthesia Plan: General   Post-op Pain Management:    Induction: Intravenous  PONV Risk Score and Plan: 1 and Treatment may vary due to age or medical condition and Ondansetron  Airway Management Planned: Oral ETT  Additional Equipment:   Intra-op Plan:   Post-operative Plan: Extubation in OR  Informed Consent: I have reviewed the patients History and Physical, chart, labs and discussed the procedure including the risks, benefits and alternatives for the proposed anesthesia with the patient or authorized representative who has indicated his/her understanding and acceptance.   Dental Advisory Given  Plan Discussed with: CRNA, Anesthesiologist and Surgeon  Anesthesia Plan Comments: (  )       Anesthesia Quick Evaluation

## 2018-02-23 NOTE — Transfer of Care (Signed)
Immediate Anesthesia Transfer of Care Note  Patient: DAARON DIMARCO  Procedure(s) Performed: XI ROBOTIC ASSISTED LAPAROSCOPIC RADICAL PROSTATECTOMY (N/A ) PELVIC LYMPH NODE DISSECTION (Bilateral )  Patient Location: PACU  Anesthesia Type:General  Level of Consciousness: drowsy and patient cooperative  Airway & Oxygen Therapy: Patient Spontanous Breathing and Patient connected to face mask oxygen  Post-op Assessment: Report given to RN and Post -op Vital signs reviewed and stable  Post vital signs: Reviewed and stable  Last Vitals:  Vitals Value Taken Time  BP 131/93 02/23/2018  5:37 PM  Temp    Pulse 84 02/23/2018  5:38 PM  Resp 12 02/23/2018  5:38 PM  SpO2 100 % 02/23/2018  5:38 PM  Vitals shown include unvalidated device data.  Last Pain:  Vitals:   02/23/18 1004  TempSrc: Oral  PainSc: 0-No pain         Complications: No apparent anesthesia complications

## 2018-02-23 NOTE — H&P (Signed)
CC/HPI: Pt presents today for pre-operative history and physical exam in anticipation of robotic assisted lap prostatectomy with BPLND on 02/23/18 by Dr. Gloriann Loan. Pt is doing well and is without complaint. He has been to the pre-op class at the hospital and been evaled by PT in the office.    Pt denies F/C, HA, CP, SOB, N/V, diarrhea/constipation, flank pain, hematuria, and dysuria.   HX:     CC: I have prostate cancer.  HPI: Stephen Cordova is a 62 year-old male established patient who is here evaluation for treatment of prostate cancer.  12/09/2017:  PSA 8.26 on 10/10/2017.  TRUS size 22.68  Abdominal and surgery includes right inguinal hernia repair, unsure of mesh status  Patient has erectile dysfunction responsive to sildenafil. Still sexually active.  No urinary incontinence   Unfortunately, pathology revealed Gleason 4+3 adenocarcinoma the prostate. Cancer involved 6 out of 12 cores, all located on the left.   He has been doing well since a prostate biopsy. No problems.   01/15/2018:  In the interval, the patient saw Dr. Tammi Klippel. He has elected to proceed with robotic-assisted laparoscopic prostatectomy. He has no complaints today.   His prostate cancer was diagnosed 12/02/2017. He does have the pathology report from his biopsy. His cancer was diagnosed by Dr. Gloriann Loan. His PSA at his time of diagnosis was 8.26.   He has not undergone surgery for treatment. He has not undergone External Beam Radiation Therapy for treatment. He has not undergone Hormonal Therapy for treatment.   He does not have urinary incontinence. He does have problems with erectile dysfunction. He has not recently had unwanted weight loss. He is not having pain in new locations.     ALLERGIES: No Known Drug Allergies    MEDICATIONS: Fish Oil  Sildenafil Citrate 100 mg tablet  Vitamin D3  Zithromax     GU PSH: Prostate Needle Biopsy - 11/25/2017      PSH Notes: L-5 S-1 1997 1999   Root canals    NON-GU  PSH: Back surgery Hernia Repair Surgical Pathology, Gross And Microscopic Examination For Prostate Needle - 11/25/2017    GU PMH: Prostate Cancer - 01/15/2018, - 12/09/2017 Elevated PSA - 11/25/2017, - 10/10/2017    NON-GU PMH: Muscle weakness (generalized) - 02/16/2018, - 02/05/2018 Other muscle spasm - 02/16/2018 GERD    FAMILY HISTORY: Brain Cancer - Daughter liver cancer - Father Lung Cancer - Father Tuberculosis - Grandfather   SOCIAL HISTORY: Marital Status: Married Current Smoking Status: Patient has never smoked.   Tobacco Use Assessment Completed: Used Tobacco in last 30 days? Does not use smokeless tobacco. Has never drank.  Does not use drugs. Drinks 1 caffeinated drink per day. Has not had a blood transfusion. Patient's occupation Tax inspector.     Notes: 2 daughters 1 is deceased   REVIEW OF SYSTEMS:    GU Review Male:   Patient denies frequent urination, hard to postpone urination, burning/ pain with urination, get up at night to urinate, leakage of urine, stream starts and stops, trouble starting your stream, have to strain to urinate , erection problems, and penile pain.  Gastrointestinal (Upper):   Patient reports indigestion/ heartburn. Patient denies nausea and vomiting.  Gastrointestinal (Lower):   Patient denies diarrhea and constipation.  Constitutional:   Patient denies fever, night sweats, weight loss, and fatigue.  Skin:   Patient denies skin rash/ lesion and itching.  Eyes:   Patient denies blurred vision and double vision.  Ears/ Nose/ Throat:  Patient denies sore throat and sinus problems.  Hematologic/Lymphatic:   Patient denies swollen glands and easy bruising.  Cardiovascular:   Patient denies leg swelling and chest pains.  Respiratory:   Patient denies cough and shortness of breath.  Endocrine:   Patient denies excessive thirst.  Musculoskeletal:   Patient reports back pain. Patient denies joint pain.  Neurological:   Patient denies  headaches and dizziness.  Psychologic:   Patient denies depression and anxiety.   VITAL SIGNS:      02/17/2018 02:11 PM 02/17/2018 02:09 PM  Weight   218 lb / 98.88 kg  Height 71 in / 180.34 cm    BP   123/80 mmHg  Heart Rate   76 /min  Temperature   98.0 F / 36.6 C  BMI 30.4 kg/m   MULTI-SYSTEM PHYSICAL EXAMINATION:    Constitutional: Well-nourished. No physical deformities. Normally developed. Good grooming.  Neck: Neck symmetrical, not swollen. Normal tracheal position.  Respiratory: Normal breath sounds. No labored breathing, no use of accessory muscles.   Cardiovascular: Regular rate and rhythm. No murmur, no gallop.   Lymphatic: No enlargement of neck, axillae, groin.  Skin: No paleness, no jaundice, no cyanosis. No lesion, no ulcer, no rash.  Neurologic / Psychiatric: Oriented to time, oriented to place, oriented to person. No depression, no anxiety, no agitation.  Gastrointestinal: No mass, no tenderness, no rigidity, non obese abdomen.  Eyes: Normal conjunctivae. Normal eyelids.  Ears, Nose, Mouth, and Throat: Left ear no scars, no lesions, no masses. Right ear no scars, no lesions, no masses. Nose no scars, no lesions, no masses. Normal hearing. Normal lips.  Musculoskeletal: Normal gait and station of head and neck.     PAST DATA REVIEWED:  Source Of History:  Patient  Records Review:   Previous Patient Records  Urine Test Review:   Urinalysis   02/17/18  Urinalysis  Urine Appearance Clear   Urine Color Yellow   Urine Glucose Neg mg/dL  Urine Bilirubin Neg mg/dL  Urine Ketones Neg mg/dL  Urine Specific Gravity 1.020   Urine Blood Neg ery/uL  Urine pH 6.0   Urine Protein Trace mg/dL  Urine Urobilinogen 0.2 mg/dL  Urine Nitrites Neg   Urine Leukocyte Esterase Neg leu/uL   PROCEDURES:          Urinalysis - 81003 Dipstick Dipstick Cont'd  Color: Yellow Bilirubin: Neg mg/dL  Appearance: Clear Ketones: Neg mg/dL  Specific Gravity: 1.020 Blood: Neg ery/uL  pH:  6.0 Protein: Trace mg/dL  Glucose: Neg mg/dL Urobilinogen: 0.2 mg/dL    Nitrites: Neg    Leukocyte Esterase: Neg leu/uL    ASSESSMENT:      ICD-10 Details  1 GU:   Prostate Cancer - C61    PLAN:           Schedule Return Visit/Planned Activity: Keep Scheduled Appointment - Schedule Surgery          Document Letter(s):  Created for Patient: Clinical Summary         Notes:   There are no changes in the patients history or physical exam since last evaluation by Dr. Gloriann Loan. Pt is scheduled to undergo RALP with BPLND on 02/23/18.    All pt's questions were answered to the best of my ability.          Next Appointment:      Next Appointment: 02/23/2018 11:45 AM    Appointment Type: Surgery     Location: [facility] [phone]  Provider: Link Snuffer, Shirley Friar, M.D.    Reason for Visit: OBS W RALP BPLND RESIDENT      Signed by Mcarthur Rossetti, PA on 02/17/18 at 3:21 PM (EST

## 2018-02-23 NOTE — Discharge Instructions (Signed)

## 2018-02-23 NOTE — Anesthesia Procedure Notes (Signed)
Procedure Name: Intubation Date/Time: 02/23/2018 1:09 PM Performed by: Montel Clock, CRNA Pre-anesthesia Checklist: Patient identified, Emergency Drugs available, Suction available, Patient being monitored and Timeout performed Patient Re-evaluated:Patient Re-evaluated prior to induction Oxygen Delivery Method: Circle system utilized Preoxygenation: Pre-oxygenation with 100% oxygen Induction Type: IV induction Ventilation: Mask ventilation without difficulty and Oral airway inserted - appropriate to patient size Laryngoscope Size: Mac and 3 Grade View: Grade II Tube type: Oral Tube size: 7.5 mm Number of attempts: 1 Airway Equipment and Method: Stylet Placement Confirmation: ETT inserted through vocal cords under direct vision,  positive ETCO2 and breath sounds checked- equal and bilateral Secured at: 23 cm Tube secured with: Tape Dental Injury: Teeth and Oropharynx as per pre-operative assessment

## 2018-02-24 ENCOUNTER — Encounter (HOSPITAL_COMMUNITY): Payer: Self-pay | Admitting: Urology

## 2018-02-24 DIAGNOSIS — C61 Malignant neoplasm of prostate: Secondary | ICD-10-CM | POA: Diagnosis not present

## 2018-02-24 LAB — BASIC METABOLIC PANEL
ANION GAP: 9 (ref 5–15)
BUN: 12 mg/dL (ref 8–23)
CO2: 23 mmol/L (ref 22–32)
Calcium: 7.9 mg/dL — ABNORMAL LOW (ref 8.9–10.3)
Chloride: 103 mmol/L (ref 98–111)
Creatinine, Ser: 0.93 mg/dL (ref 0.61–1.24)
GFR calc Af Amer: >60 mL/min (ref >60)
Glucose, Bld: 166 mg/dL — ABNORMAL HIGH (ref 70–99)
Potassium: 3.8 mmol/L (ref 3.5–5.1)
Sodium: 135 mmol/L (ref 135–145)

## 2018-02-24 LAB — HEMOGLOBIN AND HEMATOCRIT, BLOOD
HCT: 39 % (ref 39.0–52.0)
Hemoglobin: 12.8 g/dL — ABNORMAL LOW (ref 13.0–17.0)

## 2018-02-24 NOTE — Progress Notes (Signed)
Patient discharged to home with family, discharge instructions reviewed with patient and wife who verbalized understanding. New RX given to patient, leg bags and extra foley bag given to patient and changing out of bags reviewed with wife.

## 2018-02-25 NOTE — Anesthesia Postprocedure Evaluation (Signed)
Anesthesia Post Note  Patient: Stephen Cordova  Procedure(s) Performed: XI ROBOTIC ASSISTED LAPAROSCOPIC RADICAL PROSTATECTOMY (N/A ) PELVIC LYMPH NODE DISSECTION (Bilateral )     Anesthesia Type: General    Last Vitals:  Vitals:   02/24/18 0642 02/24/18 1403  BP: 114/68 119/68  Pulse: 73 80  Resp: 20 18  Temp: 36.4 C 36.9 C  SpO2: 95% 95%    Last Pain:  Vitals:   02/24/18 1403  TempSrc: Oral  PainSc:                  Akiva Brassfield

## 2019-09-08 ENCOUNTER — Other Ambulatory Visit: Payer: Self-pay | Admitting: Internal Medicine

## 2019-09-08 DIAGNOSIS — R102 Pelvic and perineal pain: Secondary | ICD-10-CM

## 2019-09-14 ENCOUNTER — Ambulatory Visit
Admission: RE | Admit: 2019-09-14 | Discharge: 2019-09-14 | Disposition: A | Discharge status: Home / Self Care | Payer: Self-pay | Source: Ambulatory Visit | Attending: Internal Medicine | Admitting: Internal Medicine

## 2019-09-14 DIAGNOSIS — R102 Pelvic and perineal pain: Secondary | ICD-10-CM

## 2019-09-15 ENCOUNTER — Other Ambulatory Visit: Payer: BLUE CROSS/BLUE SHIELD

## 2019-09-17 ENCOUNTER — Other Ambulatory Visit: Payer: BLUE CROSS/BLUE SHIELD

## 2019-10-06 ENCOUNTER — Ambulatory Visit: Payer: Self-pay | Admitting: General Surgery

## 2020-02-21 ENCOUNTER — Other Ambulatory Visit: Payer: 59

## 2020-02-21 DIAGNOSIS — Z20822 Contact with and (suspected) exposure to covid-19: Secondary | ICD-10-CM

## 2020-02-24 LAB — NOVEL CORONAVIRUS, NAA: SARS-CoV-2, NAA: NOT DETECTED

## 2020-09-28 ENCOUNTER — Ambulatory Visit (INDEPENDENT_AMBULATORY_CARE_PROVIDER_SITE_OTHER): Payer: 59 | Admitting: Sports Medicine

## 2020-09-28 ENCOUNTER — Encounter: Payer: Self-pay | Admitting: Sports Medicine

## 2020-09-28 ENCOUNTER — Telehealth: Payer: Self-pay | Admitting: Sports Medicine

## 2020-09-28 ENCOUNTER — Other Ambulatory Visit: Payer: Self-pay | Admitting: Sports Medicine

## 2020-09-28 ENCOUNTER — Ambulatory Visit (INDEPENDENT_AMBULATORY_CARE_PROVIDER_SITE_OTHER): Payer: 59

## 2020-09-28 ENCOUNTER — Other Ambulatory Visit: Payer: Self-pay

## 2020-09-28 DIAGNOSIS — M1990 Unspecified osteoarthritis, unspecified site: Secondary | ICD-10-CM

## 2020-09-28 DIAGNOSIS — M79671 Pain in right foot: Secondary | ICD-10-CM

## 2020-09-28 DIAGNOSIS — D225 Melanocytic nevi of trunk: Secondary | ICD-10-CM | POA: Insufficient documentation

## 2020-09-28 DIAGNOSIS — M779 Enthesopathy, unspecified: Secondary | ICD-10-CM | POA: Diagnosis not present

## 2020-09-28 DIAGNOSIS — M19079 Primary osteoarthritis, unspecified ankle and foot: Secondary | ICD-10-CM

## 2020-09-28 DIAGNOSIS — M79672 Pain in left foot: Secondary | ICD-10-CM | POA: Diagnosis not present

## 2020-09-28 MED ORDER — MELOXICAM 15 MG PO TABS
15.0000 mg | ORAL_TABLET | Freq: Every day | ORAL | 0 refills | Status: AC
Start: 2020-09-28 — End: ?

## 2020-09-28 NOTE — Telephone Encounter (Signed)
Pt called stated he was given some names of what shoes are best to wear and he forgot what you said after leaving the office.   I gave pt a few names and told him I would mail the avs to him and it has all the information. He said thank you.

## 2020-09-28 NOTE — Patient Instructions (Signed)
Shoe List For tennis shoes recommend:  [place] New balance Sau[place] HOKA Can be purchased at [employer] sports or [employer] arch fit Can be purchased at any major retailers  Vionic  SAS Can be purchased at [employer] or [employer]   For work shoes recommend: [employer] Work [employer] Can be purchased at a variety of places or [employer]   For casual shoes recommend: Oofos Can be purchased at [employer] sports or [employer]  Can be purchased at [employer] or [place] recommend: Power Steps Can be purchased in office/[facility] [place] Can be purchased at [employer] sports or [employer] Can be purchased at [place] recommend: Leisure centre manager at [employer] and [employer]

## 2020-09-28 NOTE — Progress Notes (Signed)
Subjective: Stephen Cordova is a 65 y.o. male patient who presents to office for evaluation of sharp shooting pains thickness over the big toe joints reports that when he called for an appointment it was painful but now no longer painful states that he has stiffness at his big toes and does not recall any injury or trauma to the area.  Patient Active Problem List   Diagnosis Date Noted   Melanocytic nevi of trunk 09/28/2020   Prostate cancer ([place]) 02/23/2018   Malignant neoplasm of prostate ([place]) 01/06/2018   Insomnia 01/01/2018   Obesity 01/01/2018   Hyperlipidemia 08/14/2017   Low back pain 08/14/2017    No current outpatient medications on file prior to visit.   No current facility-administered medications on file prior to visit.    No Known Allergies  Objective:  General: Alert and oriented x3 in no acute distress  Dermatology: No open lesions bilateral lower extremities, no webspace macerations, no ecchymosis bilateral, all nails x 10 are well manicured.  Vascular: Dorsalis Pedis and Posterior Tibial pedal pulses 2/4, Capillary Fill Time 3 seconds, (+) pedal hair growth bilateral, no edema bilateral lower extremities, Temperature gradient within normal limits.  Neurology: [name] sensation intact via light touch bilateral.  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   Xrays  Right/Left Foot    Impression: Severe joint space narrowing with dorsal bone spurs, supportive of arthritis      Assessment and Plan: Problem List Items Addressed This Visit   None Visit Diagnoses     Pain in both feet    -  Primary   Relevant Orders    DG Foot Complete Right   DG Foot Complete Left   Capsulitis       Arthritis of foot       Relevant Medications   meloxicam (MOBIC) 15 MG tablet   Foot pain, bilateral            -Complete examination performed -Xrays reviewed -Discussed treatement options; discussed hallux rigidus/arthritis deformity;conservative and  Surgical management; risks, benefits, alternatives discussed. All patient's questions answered. -Arthritic panel ordered for further evaluation for possible inflammatory type of changes to the joint -Prescribed meloxicam for patient to take -Recommend continue with good supportive shoes; shoe list provided -Patient to return to office as needed or sooner if condition worsens.  Landis Martins, DPM

## 2021-03-07 ENCOUNTER — Other Ambulatory Visit (HOSPITAL_COMMUNITY): Payer: Self-pay | Admitting: Internal Medicine

## 2021-03-23 ENCOUNTER — Other Ambulatory Visit: Payer: Self-pay

## 2021-03-23 ENCOUNTER — Ambulatory Visit (HOSPITAL_COMMUNITY)
Admission: RE | Admit: 2021-03-23 | Discharge: 2021-03-23 | Disposition: A | Discharge status: Home / Self Care | Payer: Self-pay | Source: Ambulatory Visit | Attending: Internal Medicine | Admitting: Internal Medicine

## 2021-03-23 DIAGNOSIS — E782 Mixed hyperlipidemia: Secondary | ICD-10-CM | POA: Insufficient documentation

## 2021-03-23 DIAGNOSIS — Z136 Encounter for screening for cardiovascular disorders: Secondary | ICD-10-CM | POA: Insufficient documentation

## 2021-04-14 IMAGING — US US PELVIS LIMITED
1 series · 7 of 7 positions shown · non-contrast
Comparison: None.

CLINICAL DATA: Pelvic/perineal pain. History of right inguinal
hernia

EXAM:
LIMITED ULTRASOUND OF PELVIS
TECHNIQUE: Limited transabdominal ultrasound examination of the pelvis was
performed.

[Series 1: us pelvis limited · 7 acquisitions, 7 frames shown]
[im 1/7]
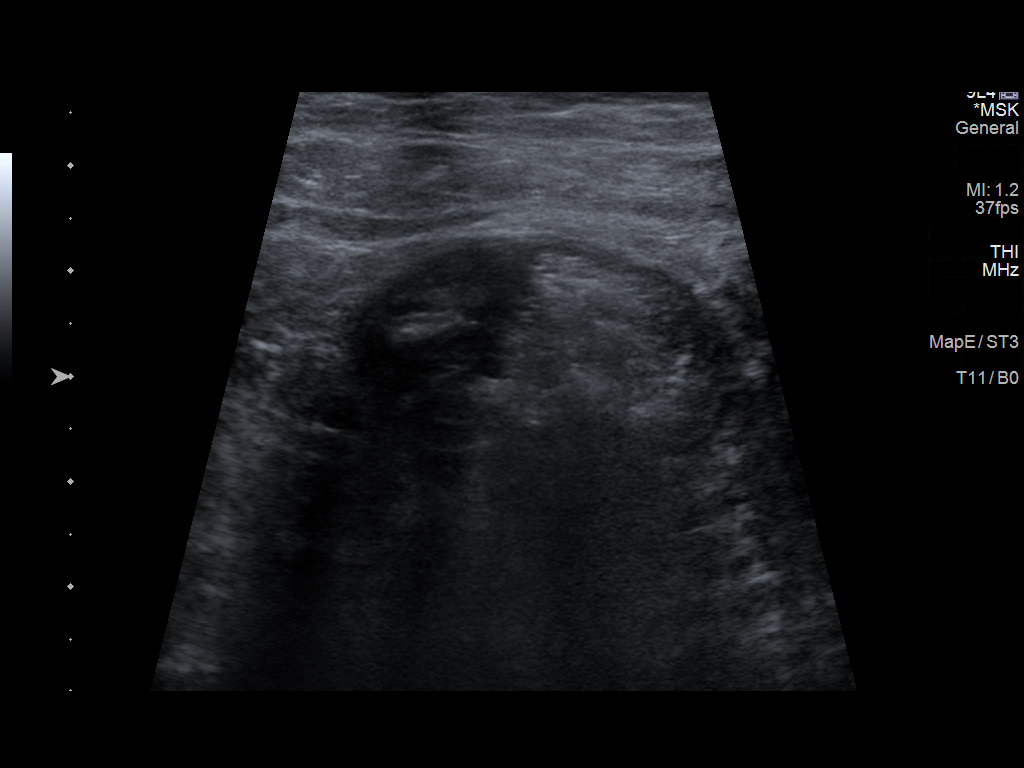
[im 2/7]
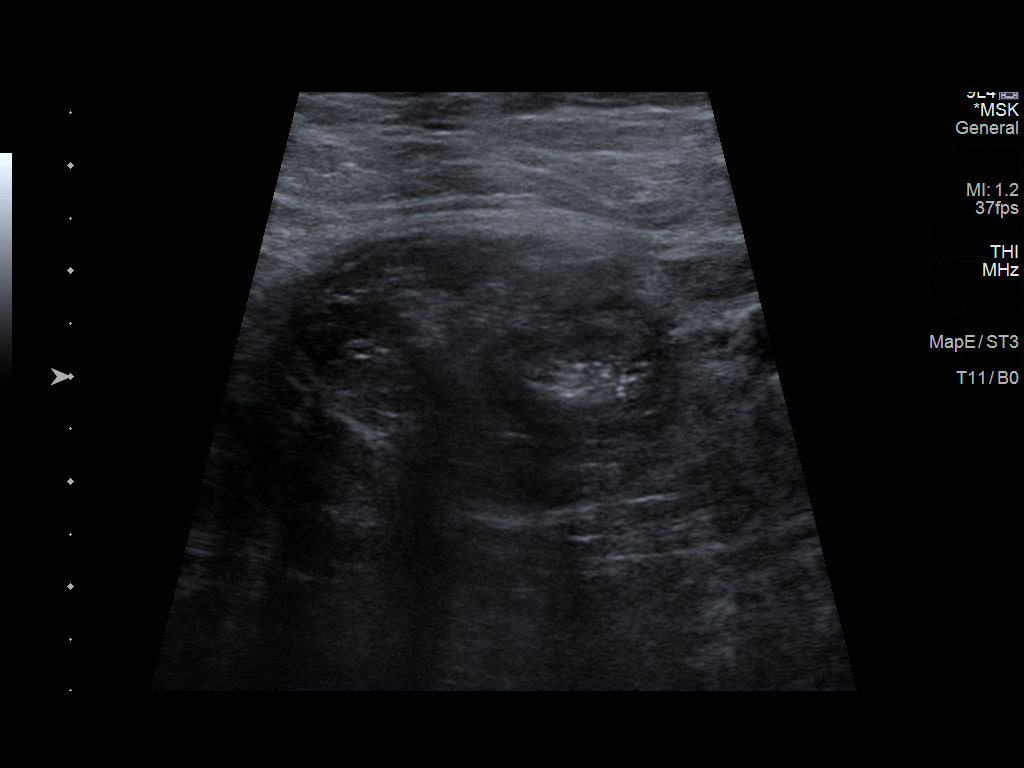
[im 3/7]
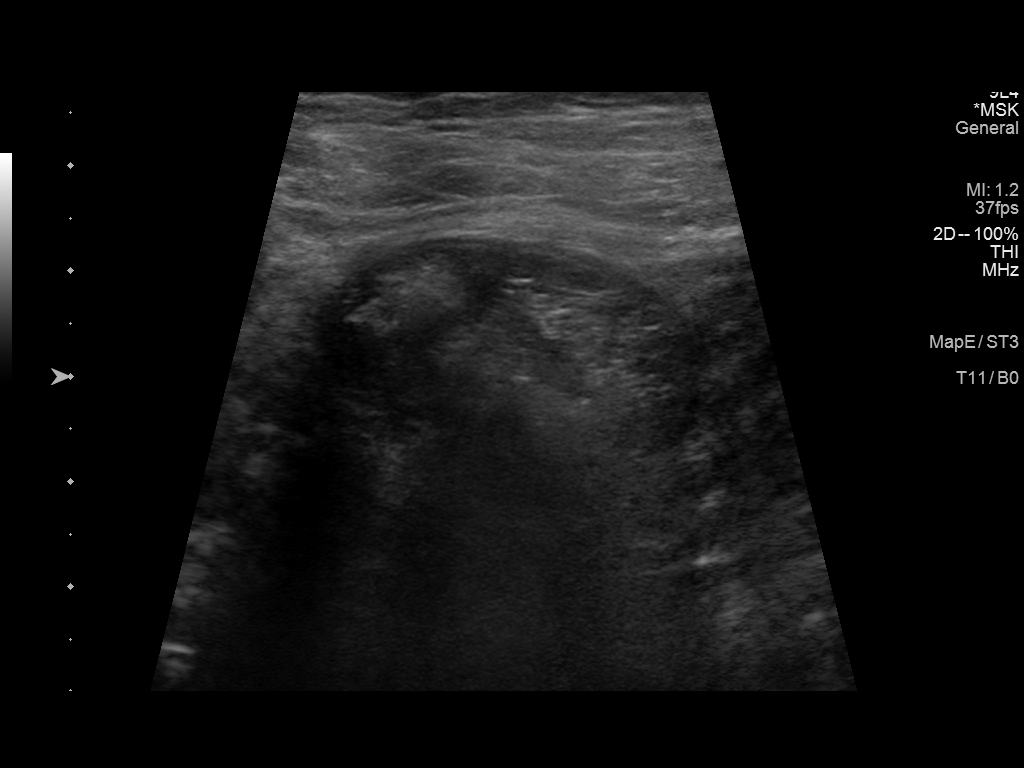
[im 4/7]
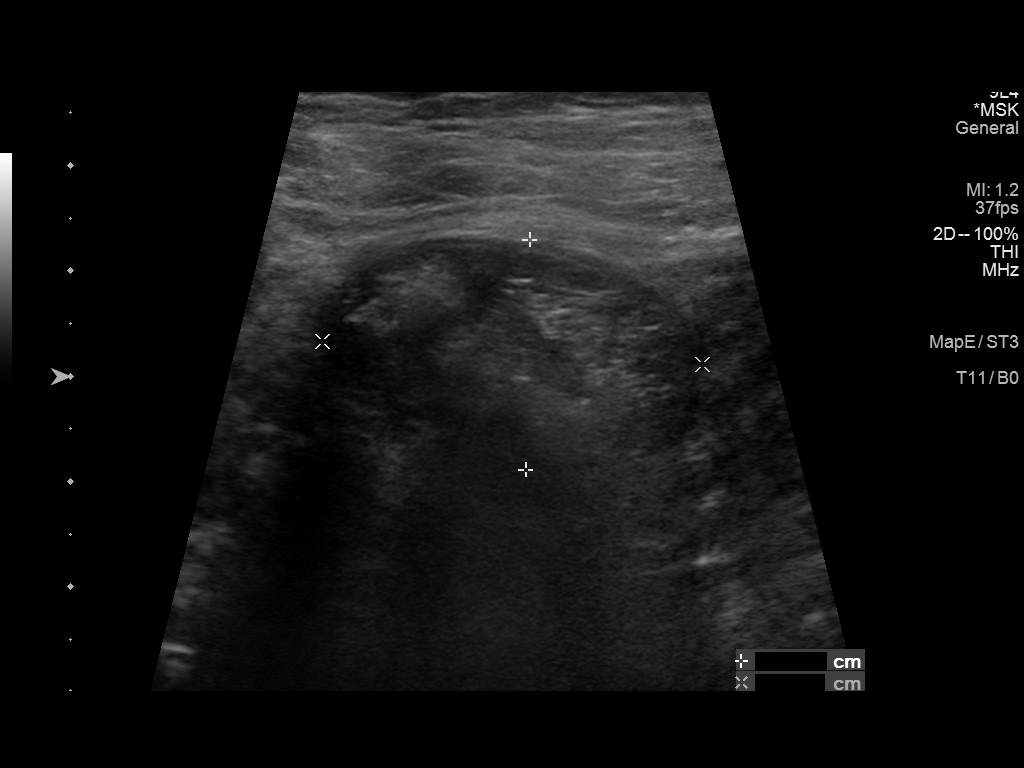
[im 5/7]
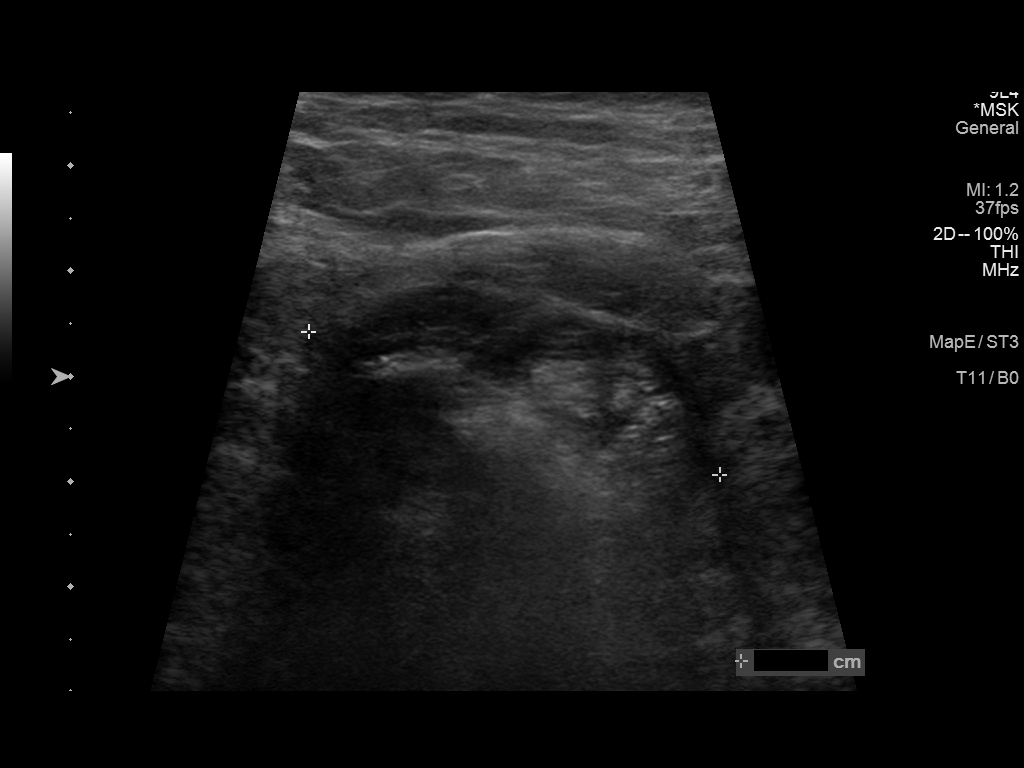
[im 6/7]
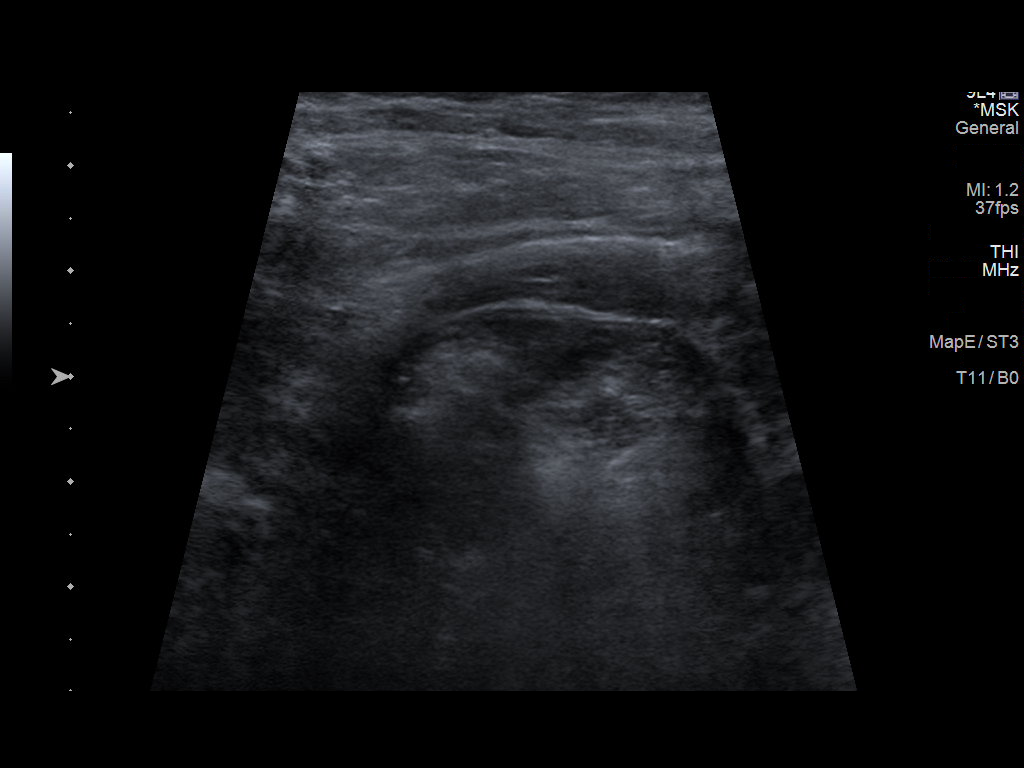
[im 7/7]
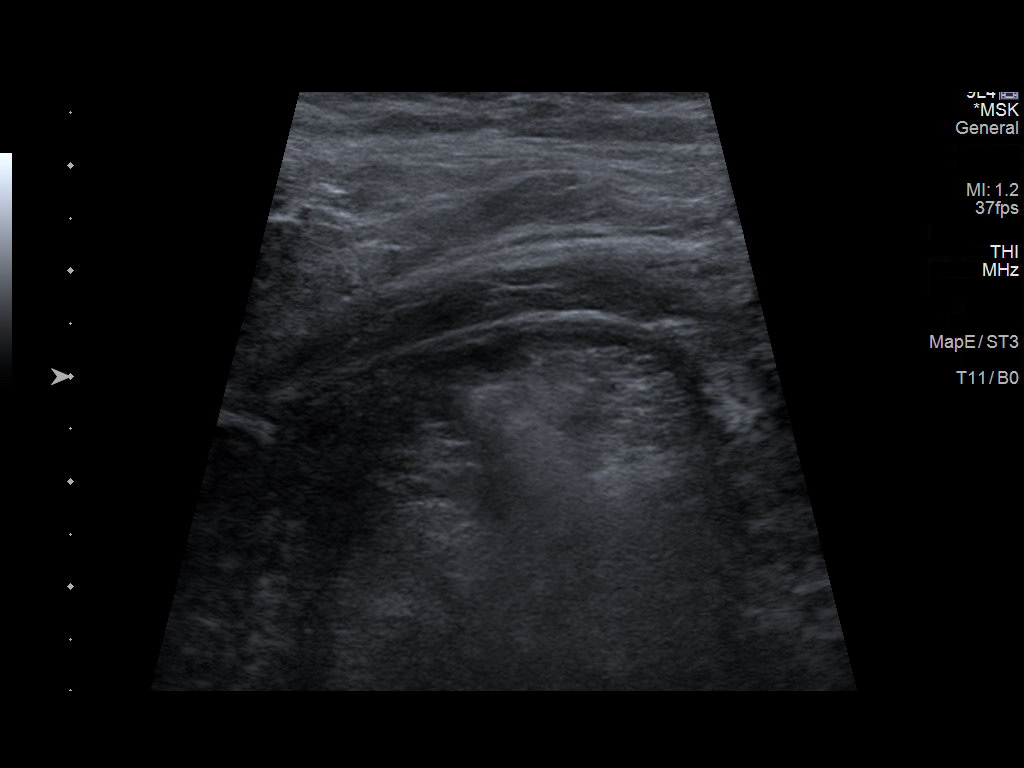

[7 of 7 positions shown; findings below may reference images not displayed]

FINDINGS: Within the right inguinal region there is a hernia containing
peristalsing loop of bowel measuring 2.2 x 3.6 x 4.1 cm.
IMPRESSION: 1. Right inguinal hernia containing peristalsing loop of bowel.

## 2021-05-23 ENCOUNTER — Institutional Professional Consult (permissible substitution): Payer: 59 | Admitting: Neurology

## 2022-08-29 ENCOUNTER — Encounter: Payer: Self-pay | Admitting: Internal Medicine

## 2022-09-23 ENCOUNTER — Other Ambulatory Visit: Payer: Self-pay | Admitting: Internal Medicine

## 2022-09-23 DIAGNOSIS — R1031 Right lower quadrant pain: Secondary | ICD-10-CM

## 2022-10-01 ENCOUNTER — Ambulatory Visit: Admission: RE | Admit: 2022-10-01 | Payer: Medicare HMO | Source: Ambulatory Visit

## 2022-10-01 DIAGNOSIS — R1031 Right lower quadrant pain: Secondary | ICD-10-CM

## 2022-10-22 IMAGING — CT CT CARDIAC CORONARY ARTERY CALCIUM SCORE
3 series · 14 of 20 positions shown, 15 images · non-contrast
Comparison: None.

Addendum:
CLINICAL DATA: Cardiovascular Disease Risk stratification

EXAM:
Coronary Calcium Score
TECHNIQUE: A gated, non-contrast computed tomography scan of the heart was
performed using 3mm slice thickness. Axial images were analyzed on a
dedicated workstation. Calcium scoring of the coronary arteries was
performed using the Agatston method.

[Series 3: ax ca scr 70% (id) · axial · 0.39mm/px · z∈[+1256,+1354]mm · 6 of 69 slices shown]
[im 10/69  vessel]
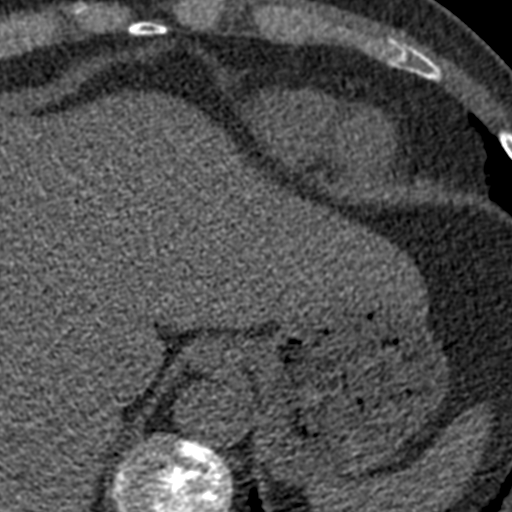
[im 20/69  vessel]
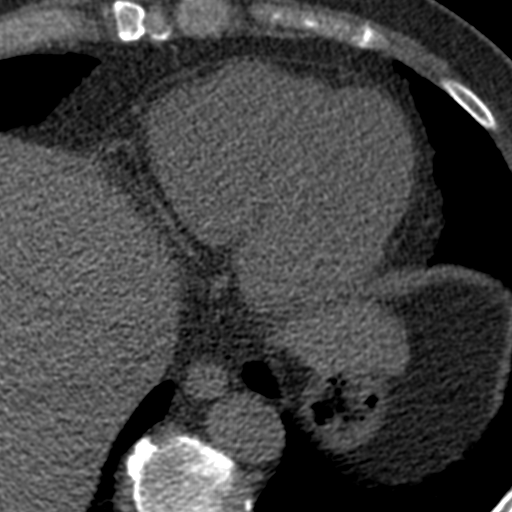
[im 30/69  vessel]
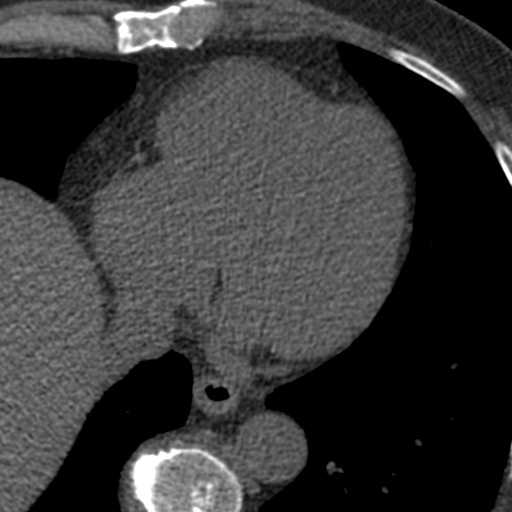
[im 39/69  vessel]
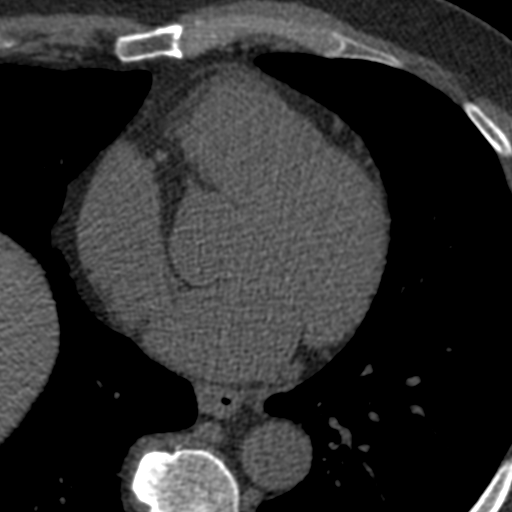
[im 49/69  vessel]
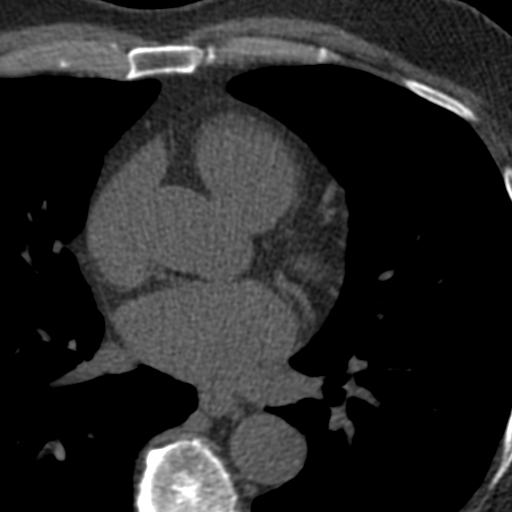
[im 59/69  vessel]
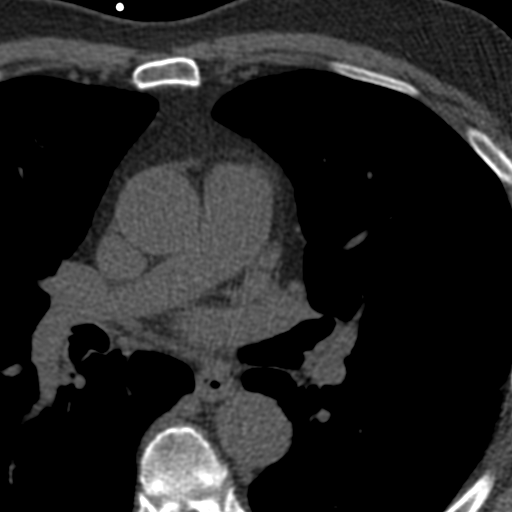

[Series 4: ax st · axial · 0.70mm/px · z∈[+1266,+1347]mm · 4 of 46 slices shown, 5 images]
[im 10/46  vessel]
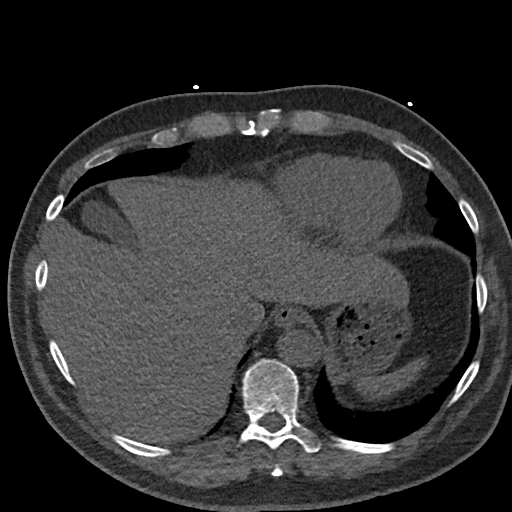
[im 10/46  lung]
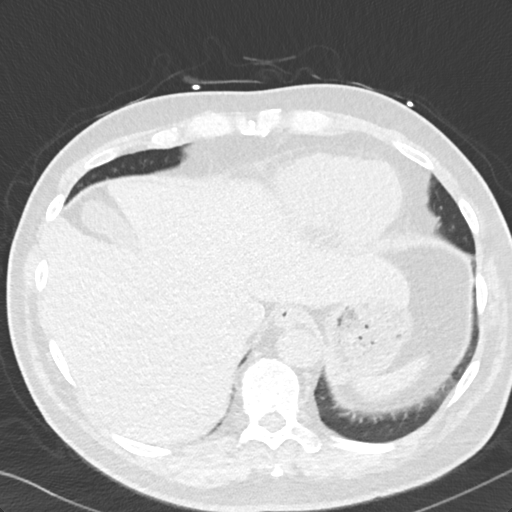
[im 19/46  vessel]
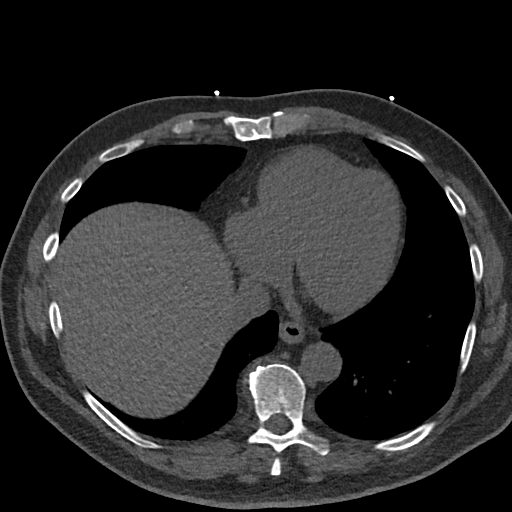
[im 28/46  vessel]
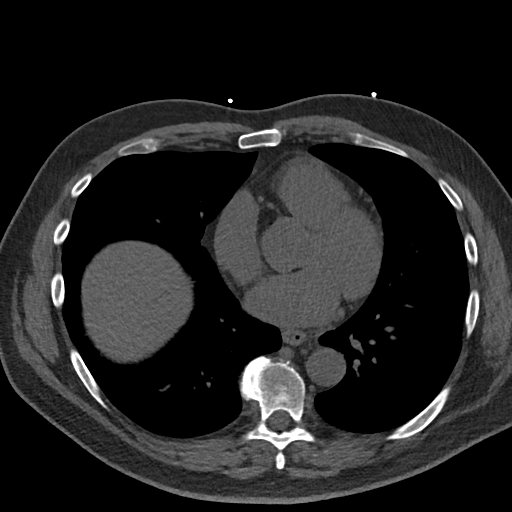
[im 37/46  vessel]
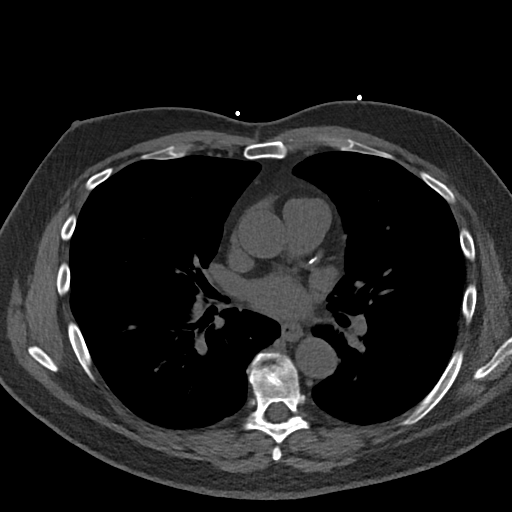

[Series 5: ax lung · axial · 0.70mm/px · z∈[+1266,+1347]mm · 4 of 46 slices shown]
[im 10/46  lung]
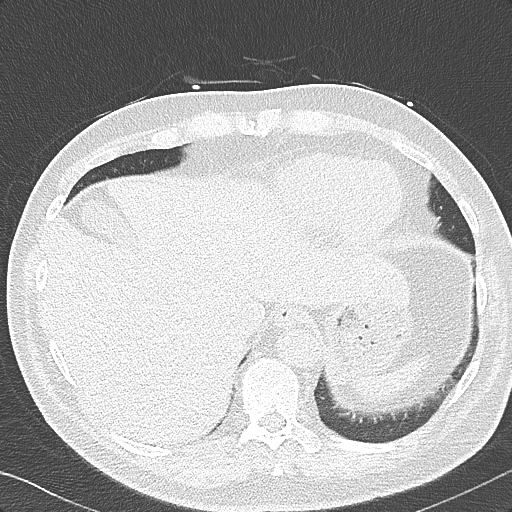
[im 19/46  lung]
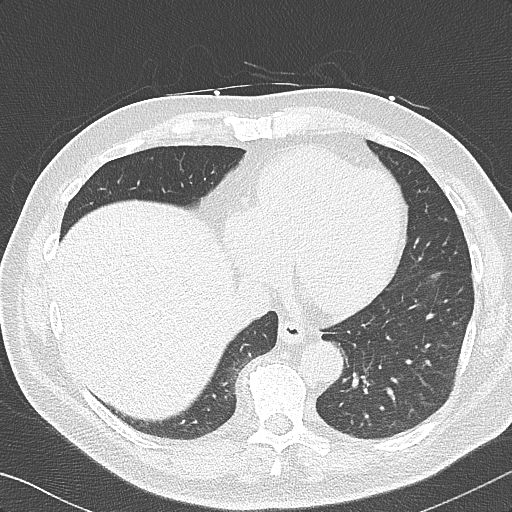
[im 28/46  lung]
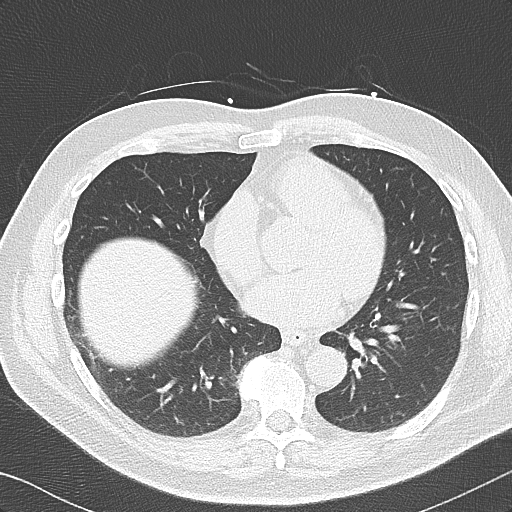
[im 37/46  lung]
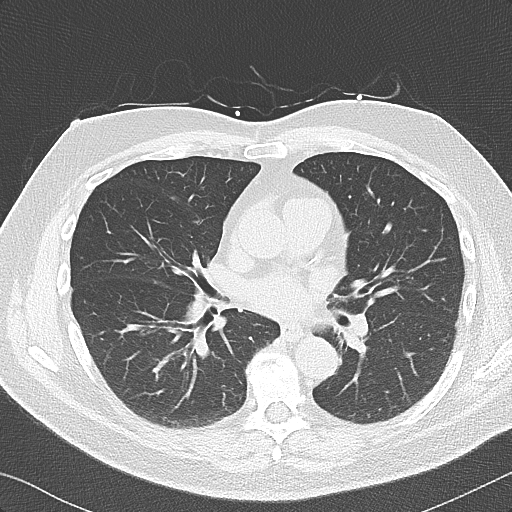

[14 of 20 positions shown; findings below may reference images not displayed]

FINDINGS: Coronary arteries: Normal origins.

Coronary Calcium Score:

Left main: 0

Left anterior descending artery:

Left circumflex artery: 0

Right coronary artery: 0

Total:

Percentile: 53rd

Pericardium: Normal.

Ascending Aorta: Normal caliber.

Non-cardiac: See separate report from [REDACTED].
IMPRESSION: Coronary calcium score of 88.7. This was 53rd percentile for age-,
race-, and sex-matched controls.



If CAC=0, it is reasonable to withhold statin therapy and reassess
in 5 to 10 years, as long as higher risk conditions are absent
(diabetes mellitus, family history of premature CHD in first degree
relatives (males <55 years; females <65 years), cigarette smoking,
or LDL >=190 mg/dL).

If CAC is 1 to 99, it is reasonable to initiate statin therapy for
patients >=55 years of age.

If CAC is >=100 or >=75th percentile, it is reasonable to initiate
statin therapy at any age.

Cardiology referral should be considered for patients with CAC
scores >=400 or >=75th percentile.

*3714 AHA/ACC/AACVPR/AAPA/ABC/DAMARIS  JEREMY/POLINI/AMNON/Rus/MARCELINA/PRINTGRAPHIC/CULAY
Guideline on the Management of Blood Cholesterol: A Report of the
American College of Cardiology/American Heart Association Task Force
on Clinical Practice Guidelines. J Am Coll Cardiol.
7786;73(24):2474-2069.

EXAM:
OVER-READ INTERPRETATION  CT CHEST

The following report is an over-read performed by radiologist Dr.
over-read does not include interpretation of cardiac or coronary
anatomy or pathology. The coronary calcium score interpretation by
the cardiologist is attached.
FINDINGS: Within the visualized portions of the thorax there are no suspicious
appearing pulmonary nodules or masses, there is no acute
consolidative airspace disease, no pleural effusions, no
pneumothorax and no lymphadenopathy. Visualized portions of the
upper abdomen are unremarkable. There are no aggressive appearing
lytic or blastic lesions noted in the visualized portions of the
skeleton.
IMPRESSION: 1. No significant incidental noncardiac findings are noted.

*** End of Addendum ***
FINDINGS: Coronary arteries: Normal origins.

Coronary Calcium Score:

Left main: 0

Left anterior descending artery:

Left circumflex artery: 0

Right coronary artery: 0

Total:

Percentile: 53rd

Pericardium: Normal.

Ascending Aorta: Normal caliber.

Non-cardiac: See separate report from [REDACTED].
IMPRESSION: Coronary calcium score of 88.7. This was 53rd percentile for age-,
race-, and sex-matched controls.



If CAC=0, it is reasonable to withhold statin therapy and reassess
in 5 to 10 years, as long as higher risk conditions are absent
(diabetes mellitus, family history of premature CHD in first degree
relatives (males <55 years; females <65 years), cigarette smoking,
or LDL >=190 mg/dL).

If CAC is 1 to 99, it is reasonable to initiate statin therapy for
patients >=55 years of age.

If CAC is >=100 or >=75th percentile, it is reasonable to initiate
statin therapy at any age.

Cardiology referral should be considered for patients with CAC
scores >=400 or >=75th percentile.

*3714 AHA/ACC/AACVPR/AAPA/ABC/DAMARIS  JEREMY/POLINI/AMNON/Rus/MARCELINA/PRINTGRAPHIC/CULAY
Guideline on the Management of Blood Cholesterol: A Report of the
American College of Cardiology/American Heart Association Task Force
on Clinical Practice Guidelines. J Am Coll Cardiol.
7786;73(24):2474-2069.

## 2023-08-06 ENCOUNTER — Other Ambulatory Visit: Payer: Self-pay

## 2023-08-06 ENCOUNTER — Encounter (HOSPITAL_BASED_OUTPATIENT_CLINIC_OR_DEPARTMENT_OTHER): Payer: Self-pay | Admitting: Emergency Medicine

## 2023-08-06 ENCOUNTER — Emergency Department (HOSPITAL_BASED_OUTPATIENT_CLINIC_OR_DEPARTMENT_OTHER): Admitting: Radiology

## 2023-08-06 ENCOUNTER — Emergency Department (HOSPITAL_BASED_OUTPATIENT_CLINIC_OR_DEPARTMENT_OTHER)
Admission: EM | Admit: 2023-08-06 | Discharge: 2023-08-06 | Disposition: A | Discharge status: Home / Self Care | Attending: Emergency Medicine | Admitting: Emergency Medicine

## 2023-08-06 DIAGNOSIS — R531 Weakness: Secondary | ICD-10-CM | POA: Diagnosis not present

## 2023-08-06 DIAGNOSIS — R072 Precordial pain: Secondary | ICD-10-CM | POA: Diagnosis not present

## 2023-08-06 DIAGNOSIS — R5383 Other fatigue: Secondary | ICD-10-CM | POA: Insufficient documentation

## 2023-08-06 DIAGNOSIS — R202 Paresthesia of skin: Secondary | ICD-10-CM | POA: Insufficient documentation

## 2023-08-06 DIAGNOSIS — R079 Chest pain, unspecified: Secondary | ICD-10-CM | POA: Diagnosis present

## 2023-08-06 LAB — CBC
HCT: 41.8 % (ref 39.0–52.0)
Hemoglobin: 14.5 g/dL (ref 13.0–17.0)
MCH: 29.3 pg (ref 26.0–34.0)
MCHC: 34.7 g/dL (ref 30.0–36.0)
MCV: 84.4 fL (ref 80.0–100.0)
Platelets: 259 10*3/uL (ref 150–400)
RBC: 4.95 MIL/uL (ref 4.22–5.81)
RDW: 13 % (ref 11.5–15.5)
WBC: 8 10*3/uL (ref 4.0–10.5)
nRBC: 0 % (ref 0.0–0.2)

## 2023-08-06 LAB — BASIC METABOLIC PANEL WITH GFR
Anion gap: 12 (ref 5–15)
BUN: 12 mg/dL (ref 8–23)
CO2: 23 mmol/L (ref 22–32)
Calcium: 9.9 mg/dL (ref 8.9–10.3)
Chloride: 103 mmol/L (ref 98–111)
Creatinine, Ser: 0.88 mg/dL (ref 0.61–1.24)
GFR, Estimated: >60 mL/min (ref >60)
Glucose, Bld: 94 mg/dL (ref 70–99)
Potassium: 4 mmol/L (ref 3.5–5.1)
Sodium: 138 mmol/L (ref 135–145)

## 2023-08-06 LAB — TROPONIN T, HIGH SENSITIVITY
Troponin T High Sensitivity: <15 ng/L (ref <19)
Troponin T High Sensitivity: <15 ng/L (ref <19)

## 2023-08-06 NOTE — Discharge Instructions (Addendum)
 Please read and follow all provided instructions.  Your diagnoses today include:  1. Precordial pain    Tests performed today include: An EKG of your heart: There is a irregularity in one of the leads, but overall the EKG is not concerning for arrhythmia or heart attack A chest x-ray: Is clear Cardiac enzymes - a blood test for heart muscle damage, were both normal Blood counts and electrolytes: Were normal Vital signs. See below for your results today.   Medications prescribed:  None  Take any prescribed medications only as directed.  Follow-up instructions: Please follow-up with the cardiology referral for further advice regarding your chest pain.  They will help determine if you need additional testing or not.  Return instructions:  SEEK IMMEDIATE MEDICAL ATTENTION IF: You have severe chest pain, especially if the pain is crushing or pressure-like and spreads to the arms, back, neck, or jaw, or if you have sweating, nausea or vomiting, or trouble with breathing. THIS IS AN EMERGENCY. Do not wait to see if the pain will go away. Get medical help at once. Call 911. DO NOT drive yourself to the hospital.  Your chest pain gets worse and does not go away after a few minutes of rest.  You have an attack of chest pain lasting longer than what you usually experience.  You have significant dizziness, if you pass out, or have trouble walking.  You have chest pain not typical of your usual pain for which you originally saw your caregiver.  You have any other emergent concerns regarding your health.  Additional Information: Chest pain comes from many different causes. Your caregiver has diagnosed you as having chest pain that is not specific for one problem, but does not require admission.  You are at low risk for an acute heart condition or other serious illness.   Your vital signs today were: BP (!) 136/103   Pulse 66   Temp (!) 97.5 F (36.4 C)   Resp 12   Ht 5\' 11"  (1.803 m)   Wt  95.3 kg   SpO2 97%   BMI 29.29 kg/m  If your blood pressure (BP) was elevated above 135/85 this visit, please have this repeated by your doctor within one month. --------------

## 2023-08-06 NOTE — ED Triage Notes (Signed)
 Pt via pov from home with chest pain and arm numbness last night that awakened him during the night. He reports that it is better but that the numbness in the left arm continues. Pt denies sob, n/v. Pt alert & oriented, nad noted.

## 2023-08-06 NOTE — ED Notes (Signed)
 Pt discharged home and given discharge paperwork. Opportunities given for questions. Pt verbalizes understanding. PIV removed x1. Jillyn Hidden , RN

## 2023-08-06 NOTE — ED Provider Notes (Signed)
[name] Provider Note   CSN: [id number] Arrival date & time: 08/06/23  1153     History  Chief Complaint  Patient presents with   Chest Pain    Stephen Cordova. is a 68 y.o. male.  Patient with history of borderline cholesterol, 53rd percentile on coronary calcium CT performed in early 2023 (LAD calcium) --presents to the emergency department for evaluation of chest heaviness and a numb sensation into his left arm with associated generalized fatigue.  Patient was feeling in his normal state of health yesterday.  He did some light housework including mowing without any difficulty.  He reports awaking from sleep around 2 AM.  This is when he felt the chest tightness and arm numbness.  He states that he stayed up for about 3 hours.  He had some tea.  No associated shortness of breath, diaphoresis or vomiting.  He was able to sleep for several more hours and then woke around 9 AM still feeling some weakness in the arm.  He was able to grasp normally.  He states that they talked with a friend and their doctor and it was recommended that he come to be checked.  No cough.  No fevers.  No strong family history of coronary artery disease.  Patient does not smoke.  No history of hypertension or diabetes.  Patient does report undergoing physical therapy after lower back surgery in the past year.       Home Medications Prior to Admission medications   Medication Sig Start Date End Date Taking? Authorizing Provider  meloxicam  (MOBIC ) 15 MG tablet Take 1 tablet (15 mg total) by mouth daily. 09/28/20   Stover, Titorya, DPM      Allergies    Patient has no known allergies.    Review of Systems   Review of Systems  Physical Exam Updated Vital Signs BP (!) 148/103 (BP Location: Right Arm)   Pulse 76   Temp (!) 97.5 F (36.4 C)   Resp 20   Ht 5\' 11"  (1.803 m)   Wt 95.3 kg   SpO2 97%   BMI 29.29 kg/m   Physical Exam Vitals and nursing  note reviewed.  Constitutional:      Appearance: He is well-developed. He is not diaphoretic.  HENT:     Head: Normocephalic and atraumatic.     Right Ear: Tympanic membrane, ear canal and external ear normal.     Left Ear: Tympanic membrane, ear canal and external ear normal.     Nose: Nose normal.     Mouth/Throat:     Mouth: Mucous membranes are not dry.     Pharynx: Uvula midline.  Eyes:     General: Lids are normal.     Conjunctiva/sclera: Conjunctivae normal.     Pupils: Pupils are equal, round, and reactive to light.  Neck:     Vascular: Normal carotid pulses. No carotid bruit or JVD.     Trachea: Trachea normal. No tracheal deviation.  Cardiovascular:     Rate and Rhythm: Normal rate and regular rhythm.     Pulses: No decreased pulses.          Radial pulses are 2+ on the right side and 2+ on the left side.     Heart sounds: Normal heart sounds, S1 normal and S2 normal. Heart sounds not distant. No murmur heard. Pulmonary:     Effort: Pulmonary effort is normal. No respiratory distress.  Breath sounds: Normal breath sounds. No wheezing.  Chest:     Chest wall: No tenderness.  Abdominal:     General: Bowel sounds are normal.     Palpations: Abdomen is soft.     Tenderness: There is no abdominal tenderness. There is no guarding or rebound.  Musculoskeletal:        General: Normal range of motion.     Cervical back: Normal range of motion and neck supple. No tenderness or bony tenderness. No muscular tenderness.     Right lower leg: No edema.     Left lower leg: No edema.  Skin:    General: Skin is warm and dry.     Coloration: Skin is not pale.  Neurological:     Mental Status: He is alert and oriented to person, place, and time. Mental status is at baseline.     GCS: GCS eye subscore is 4. GCS verbal subscore is 5. GCS motor subscore is 6.     Cranial Nerves: No cranial nerve deficit.     Sensory: No sensory deficit.     Motor: No abnormal muscle tone.      Coordination: Coordination normal.     Gait: Gait normal.  Psychiatric:        Mood and Affect: Mood normal.     ED Results / Procedures / Treatments   Labs (all labs ordered are listed, but only abnormal results are displayed) Labs Reviewed  BASIC METABOLIC PANEL WITH GFR  CBC  TROPONIN T, HIGH SENSITIVITY  TROPONIN T, HIGH SENSITIVITY    EKG EKG Interpretation Date/Time:  Wednesday August 06 2023 12:00:27 EDT Ventricular Rate:  78 PR Interval:  200 QRS Duration:  85 QT Interval:  357 QTC Calculation: 407 R Axis:   42  Text Interpretation: Sinus rhythm no prior ECG for comparison No STEMI Confirmed by Wynell Heath ([id number]) on 08/06/2023 12:14:26 PM  Radiology No results found.  Procedures Procedures    Medications Ordered in ED Medications - No data to display  ED Course/ Medical Decision Making/ A&P    Patient seen and examined. History obtained directly from patient.   Labs/EKG: Personally reviewed and interpreted EKG as above.  Poor baseline, will repeat.  CBC, BMP, troponin ordered and pending.  Imaging: Chest x-ray ordered.  Medications/Fluids: None ordered  Most recent vital signs reviewed and are as follows: BP (!) 148/103 (BP Location: Right Arm)   Pulse 76   Temp (!) 97.5 F (36.4 C)   Resp 20   Ht 5\' 11"  (1.803 m)   Wt 95.3 kg   SpO2 97%   BMI 29.29 kg/m   Initial impression: Atypical chest pain and arm numbness.  Patient has a reassuring neuroexam without any deficits.  Cardiac workup underway.  He did have coronary CT performed 2 years ago.  3:41 PM Reassessment performed. Patient appears comfortable, stable.  Reports minimal if any symptoms.  Patient discussed with and seen by Dr. Tegeler.   Labs personally reviewed and interpreted including: CBC unremarkable; BMP unremarkable; troponin <15 x 2.  Repeat EKG, improved quality, slightly prolonged PR at 225, V2 with slight elevation, but no significant elevation in any contiguous leads.   Taking into account for baseline, this appears similar to EKG on arrival and unchanged.  Imaging personally visualized and interpreted including: Chest x-ray, agree negative.  Reviewed pertinent lab work and imaging with patient at bedside. Questions answered.   Currently plan for close outpatient follow-up.  Will place cardiology referral  for patient given chest pain in setting of known coronary artery disease as of 2 years ago, albeit mild.  Discussed plan with patient and wife and they are both comfortable with this and in agreement.  Most current vital signs reviewed and are as follows: BP (!) 136/103   Pulse 66   Temp (!) 97.5 F (36.4 C)   Resp 12   Ht 5\' 11"  (1.803 m)   Wt 95.3 kg   SpO2 97%   BMI 29.29 kg/m   Plan: Discharge to home.   Prescriptions written for: None  Return and follow-up instructions: I encouraged patient to return to ED with severe chest pain, especially if the pain is crushing or pressure-like and spreads to the arms, back, neck, or jaw, or if they have associated sweating, vomiting, or shortness of breath with the pain, or significant pain with activity. We discussed that the evaluation here today indicates a low-risk of serious cause of chest pain, including heart trouble or a blood clot, but no evaluation is perfect and chest pain can evolve with time. The patient verbalized understanding and agreed.  I encouraged patient to follow-up with cardiology as planned.                                   Medical Decision Making Amount and/or Complexity of Data Reviewed Labs: ordered. Radiology: ordered.   For this patient's complaint of chest pain, the following emergent conditions were considered on the differential diagnosis: acute coronary syndrome, pulmonary embolism, pneumothorax, myocarditis, pericardial tamponade, aortic dissection, thoracic aortic aneurysm complication, esophageal perforation.   Other causes were also considered including:  gastroesophageal reflux disease, musculoskeletal pain including costochondritis, pneumonia/pleurisy, herpes zoster, pericarditis.  In regards to possibility of ACS, patient has atypical features of pain, overall non-ischemic EKG (unfortunately no old for comparison) and negative troponin(s) x 2. Heart score was calculated to be 4.   In regards to possibility of PE, symptoms are atypical for PE and risk profile is low, making PE low likelihood.   At this point, symptoms improved, atypical for ACS or PE, cardiac workup is negative.  Given risk factors, no previous cardiac evaluation other than calcium score, feel that close outpatient follow-up is reasonable and patient is in agreement with this.  Do not feel that patient needs to be admitted at this point.  The patient's vital signs, pertinent lab work and imaging were reviewed and interpreted as discussed in the ED course. Hospitalization was considered for further testing, treatments, or serial exams/observation. However as patient is well-appearing, has a stable exam, and reassuring studies today, I do not feel that they warrant admission at this time. This plan was discussed with the patient who verbalizes agreement and comfort with this plan and seems reliable and able to return to the Emergency Department with worsening or changing symptoms.          Final Clinical Impression(s) / ED Diagnoses Final diagnoses:  Precordial pain    Rx / DC Orders ED Discharge Orders          Ordered    Ambulatory referral to Cardiology       Comments: If you have not heard from the Cardiology office within the next 72 hours please call [phone].   08/06/23 1538              Lyna Sandhoff, PA-C 08/06/23 1545    Tegeler, Marine Sia, MD 08/09/23  0717  

## 2023-10-15 ENCOUNTER — Other Ambulatory Visit: Payer: Self-pay

## 2023-10-15 ENCOUNTER — Emergency Department (HOSPITAL_BASED_OUTPATIENT_CLINIC_OR_DEPARTMENT_OTHER)

## 2023-10-15 ENCOUNTER — Emergency Department (HOSPITAL_BASED_OUTPATIENT_CLINIC_OR_DEPARTMENT_OTHER)
Admission: EM | Admit: 2023-10-15 | Discharge: 2023-10-15 | Disposition: A | Discharge status: Home / Self Care | Attending: Emergency Medicine | Admitting: Emergency Medicine

## 2023-10-15 ENCOUNTER — Encounter (HOSPITAL_BASED_OUTPATIENT_CLINIC_OR_DEPARTMENT_OTHER): Payer: Self-pay

## 2023-10-15 DIAGNOSIS — Z8546 Personal history of malignant neoplasm of prostate: Secondary | ICD-10-CM | POA: Diagnosis not present

## 2023-10-15 DIAGNOSIS — R197 Diarrhea, unspecified: Secondary | ICD-10-CM

## 2023-10-15 DIAGNOSIS — K529 Noninfective gastroenteritis and colitis, unspecified: Secondary | ICD-10-CM

## 2023-10-15 DIAGNOSIS — R109 Unspecified abdominal pain: Secondary | ICD-10-CM | POA: Diagnosis present

## 2023-10-15 LAB — COMPREHENSIVE METABOLIC PANEL WITH GFR
ALT: 20 U/L (ref 0–44)
AST: 24 U/L (ref 15–41)
Albumin: 4.1 g/dL (ref 3.5–5.0)
Alkaline Phosphatase: 62 U/L (ref 38–126)
Anion gap: 13 (ref 5–15)
BUN: 12 mg/dL (ref 8–23)
CO2: 23 mmol/L (ref 22–32)
Calcium: 8.8 mg/dL — ABNORMAL LOW (ref 8.9–10.3)
Chloride: 101 mmol/L (ref 98–111)
Creatinine, Ser: 0.91 mg/dL (ref 0.61–1.24)
GFR, Estimated: >60 mL/min (ref >60)
Glucose, Bld: 131 mg/dL — ABNORMAL HIGH (ref 70–99)
Potassium: 3.7 mmol/L (ref 3.5–5.1)
Sodium: 137 mmol/L (ref 135–145)
Total Bilirubin: 0.5 mg/dL (ref 0.0–1.2)
Total Protein: 7 g/dL (ref 6.5–8.1)

## 2023-10-15 LAB — CBC
HCT: 40 % (ref 39.0–52.0)
Hemoglobin: 13.7 g/dL (ref 13.0–17.0)
MCH: 29.1 pg (ref 26.0–34.0)
MCHC: 34.3 g/dL (ref 30.0–36.0)
MCV: 84.9 fL (ref 80.0–100.0)
Platelets: 291 K/uL (ref 150–400)
RBC: 4.71 MIL/uL (ref 4.22–5.81)
RDW: 12.8 % (ref 11.5–15.5)
WBC: 8.9 K/uL (ref 4.0–10.5)
nRBC: 0 % (ref 0.0–0.2)

## 2023-10-15 LAB — URINALYSIS, ROUTINE W REFLEX MICROSCOPIC
Bilirubin Urine: NEGATIVE
Glucose, UA: NEGATIVE mg/dL
Hgb urine dipstick: NEGATIVE
Ketones, ur: NEGATIVE mg/dL
Leukocytes,Ua: NEGATIVE
Nitrite: NEGATIVE
Protein, ur: NEGATIVE mg/dL
Specific Gravity, Urine: 1.009 (ref 1.005–1.030)
pH: 7 (ref 5.0–8.0)

## 2023-10-15 LAB — CLOSTRIDIUM DIFFICILE BY PCR, REFLEXED
Hypervirulent Strain: NEGATIVE
Toxigenic C. Difficile by PCR: POSITIVE — AB

## 2023-10-15 LAB — C DIFFICILE QUICK SCREEN W PCR REFLEX
C Diff antigen: POSITIVE — AB
C Diff toxin: NEGATIVE

## 2023-10-15 LAB — LIPASE, BLOOD: Lipase: 19 U/L (ref 11–51)

## 2023-10-15 MED ORDER — LACTATED RINGERS IV BOLUS
1000.0000 mL | Freq: Once | INTRAVENOUS | Status: AC
  Administered 2023-10-15 (×2): 1000 mL via INTRAVENOUS

## 2023-10-15 MED ORDER — IOHEXOL 300 MG/ML  SOLN
100.0000 mL | Freq: Once | INTRAMUSCULAR | Status: AC | PRN
  Administered 2023-10-15 (×2): 100 mL via INTRAVENOUS

## 2023-10-15 MED ORDER — FIDAXOMICIN 200 MG PO TABS: 200.0000 mg | ORAL_TABLET | Freq: Two times a day (BID) | ORAL | 0 refills | Status: AC

## 2023-10-15 NOTE — Discharge Instructions (Signed)
 Try and keep yourself hydrated.  If the C. difficile test is positive stop the vancomycin and start the fidaxomicin .  If the C. difficile test is negative discussed with Dr. Margean office about possible treatment for the colitis.

## 2023-10-15 NOTE — ED Provider Notes (Signed)
 [place] EMERGENCY DEPARTMENT AT [facility] Provider Note   CSN: [id number] Arrival date & time: 10/15/23  1142     Patient presents with: Abdominal Pain and Diarrhea   Stephen Cordova. is a 68 y.o. male.   Patient is a 68 year old male with a history of GERD, iron to sufficiency and prior prostate cancer who is presenting today due to constant diarrhea.  Patient reports that 2 weeks ago he received a course of amoxicillin due to a dental infection.  He took 7 days of amoxicillin and the day he stopped his when the diarrhea started.  He is having 15-18 episodes of diarrhea per day for the last 7 days.  Saw his doctor 2 days ago and was started on oral vancomycin due to concern for possible C. difficile.  Patient reports he did have an episode of C. difficile about 25 years ago after having antibiotics for procedure.  He denies any recent travel and denies any blood in his stool.  He has not had any nausea or vomiting but continues to have pain in his lower abdomen left lower quadrant is the worst.  He has not had fever that he is aware of and denies any urinary symptoms.  He is still eating and drinking.  He is taken now 8 doses of the vancomycin over the last 2 days and reports still having diarrhea but the stool he had right before coming into the ER today was a little more formed than he has had in the last week.  The history is provided by the patient and the spouse.  Abdominal Pain Associated symptoms: diarrhea   Diarrhea Associated symptoms: abdominal pain        Prior to Admission medications   Medication Sig Start Date End Date Taking? Authorizing Provider  rosuvastatin (CRESTOR) 10 MG tablet Take 10 mg by mouth at bedtime. 09/02/23  Yes [provider]  vancomycin (VANCOCIN) 250 MG capsule Take 250 mg by mouth 4 (four) times daily. 10/13/23  Yes [provider]  meloxicam  (MOBIC ) 15 MG tablet Take 1 tablet (15 mg total) by mouth daily. 09/28/20    Stover, Titorya, DPM    Allergies: Patient has no known allergies.    Review of Systems  Gastrointestinal:  Positive for abdominal pain and diarrhea.    Updated Vital Signs BP 119/81 (BP Location: Right Arm)   Pulse 88   Temp 97.8 F (36.6 C) (Oral)   Resp 17   Ht 5' 11 (1.803 m)   Wt 93 kg   SpO2 95%   BMI 28.59 kg/m   Physical Exam Vitals and nursing note reviewed.  Constitutional:      General: He is not in acute distress.    Appearance: He is well-developed.  HENT:     Head: Normocephalic and atraumatic.     Mouth/Throat:     Mouth: Mucous membranes are moist.  Eyes:     Conjunctiva/sclera: Conjunctivae normal.     Pupils: Pupils are equal, round, and reactive to light.  Cardiovascular:     Rate and Rhythm: Normal rate and regular rhythm.     Heart sounds: No murmur heard. Pulmonary:     Effort: Pulmonary effort is normal. No respiratory distress.     Breath sounds: Normal breath sounds. No wheezing or rales.  Abdominal:     General: There is no distension.     Palpations: Abdomen is soft.     Tenderness: There is abdominal tenderness in the  right lower quadrant, suprapubic area and left lower quadrant. There is no right CVA tenderness, left CVA tenderness, guarding or rebound.  Musculoskeletal:        General: No tenderness. Normal range of motion.     Cervical back: Normal range of motion and neck supple.     Right lower leg: No edema.     Left lower leg: No edema.  Skin:    General: Skin is warm and dry.     Findings: No erythema or rash.  Neurological:     Mental Status: He is alert and oriented to person, place, and time.  Psychiatric:        Behavior: Behavior normal.     (all labs ordered are listed, but only abnormal results are displayed) Labs Reviewed  COMPREHENSIVE METABOLIC PANEL WITH GFR - Abnormal; Notable for the following components:      Result Value   Glucose, Bld 131 (*)    Calcium 8.8 (*)    All other components within normal  limits  C DIFFICILE QUICK SCREEN W PCR REFLEX    LIPASE, BLOOD  CBC  URINALYSIS, ROUTINE W REFLEX MICROSCOPIC    EKG: None  Radiology: No results found.   Procedures   Medications Ordered in the ED  lactated ringers  bolus 1,000 mL (1,000 mLs Intravenous New Bag/Given 10/15/23 1233)  iohexol  (OMNIPAQUE ) 300 MG/ML solution 100 mL (100 mLs Intravenous Contrast Given 10/15/23 1352)                                    Medical Decision Making Amount and/or Complexity of Data Reviewed Labs: ordered. Radiology: ordered.  Risk Prescription drug management.   Pt with multiple medical problems and comorbidities and presenting today with a complaint that caries a high risk for morbidity and mortality.  Here today with continued abdominal pain and diarrhea.  Concern for C. difficile colitis, diverticulitis, dehydration, electrolyte abnormality, AKI.  Low suspicion for perforation, appendicitis, UTI.  Pt given IVF.  Labs and imaging pending.  2:11 PM I independently interpreted patient's labs and CBC, CMP, lipase and UA are all within normal limits.  I have independently visualized and interpreted pt's images today. CT with findings of colitis but no signs of perforation or abscess.  No renal stones or appendicitis.      Final diagnoses:  None    ED Discharge Orders     None          Doretha Folks, MD 10/16/23 [phone]

## 2023-10-15 NOTE — ED Notes (Signed)
 Ambulatory to restroom

## 2023-10-15 NOTE — ED Notes (Signed)
 Reviewed AVS/discharge instruction with patient. Time allotted for and all questions answered. Patient is agreeable for d/c and escorted to ed exit by staff.

## 2023-10-15 NOTE — ED Provider Notes (Signed)
  Physical Exam  BP 124/75   Pulse 68   Temp 97.8 F (36.6 C) (Oral)   Resp 18   Ht 5' 11 (1.803 m)   Wt 93 kg   SpO2 94%   BMI 28.59 kg/m   Physical Exam  Procedures  Procedures  ED Course / MDM    Medical Decision Making Amount and/or Complexity of Data Reviewed Labs: ordered. Radiology: ordered.  Risk Prescription drug management.   Received in signout.  Diarrhea.  Possible C. difficile versus colitis/diverticulitis.  However C. difficile testing will not come back at a reasonable time.  CT scan does show potential diverticulitis/colitis.  Still could be consistent with the C. difficile.  Discussed with patient and his wife.  Will write for [facility]  and they will check C. difficile testing result.  If positive they will fill the prescription.  If negative will discuss with PCP about possible different antibiotics versus monitoring for diverticulitis versus colitis.       Patsey Lot, MD 10/15/23 [phone]

## 2023-10-15 NOTE — ED Triage Notes (Signed)
 Arrives POV with complaints of diarrhea x1 week. Patient states that he was started on antibiotics over for a suspected tooth infection and developed diarrhea (having a bowel movement every hour). Patient was started on Vancomycin for suspected C. Diff. He did not have a stool specimen tested beforehand.

## 2023-10-17 ENCOUNTER — Telehealth (HOSPITAL_COMMUNITY): Payer: Self-pay

## 2023-10-17 NOTE — Telephone Encounter (Signed)
 Pt.s wife called and had questions concerning pt.s discharge instructions.  All questions answered and pt's wife verbalized understanding.  She reports that patient is feeling much better

## 2023-11-20 ENCOUNTER — Encounter (HOSPITAL_BASED_OUTPATIENT_CLINIC_OR_DEPARTMENT_OTHER): Payer: Self-pay
